# Patient Record
Sex: Female | Born: 1965 | Race: White | Hispanic: No | Marital: Married | Smoking: Current every day smoker
Health system: Southern US, Community
[De-identification: ages and names within clinical notes are randomized; demographics above are authoritative.]

## PROBLEM LIST (undated history)

## (undated) DIAGNOSIS — M797 Fibromyalgia: Secondary | ICD-10-CM

## (undated) DIAGNOSIS — E785 Hyperlipidemia, unspecified: Secondary | ICD-10-CM

## (undated) DIAGNOSIS — F419 Anxiety disorder, unspecified: Secondary | ICD-10-CM

## (undated) DIAGNOSIS — M1990 Unspecified osteoarthritis, unspecified site: Secondary | ICD-10-CM

## (undated) DIAGNOSIS — Z5320 Procedure and treatment not carried out because of patient's decision for unspecified reasons: Secondary | ICD-10-CM

## (undated) DIAGNOSIS — I10 Essential (primary) hypertension: Secondary | ICD-10-CM

## (undated) DIAGNOSIS — D219 Benign neoplasm of connective and other soft tissue, unspecified: Secondary | ICD-10-CM

## (undated) DIAGNOSIS — K0889 Other specified disorders of teeth and supporting structures: Secondary | ICD-10-CM

## (undated) DIAGNOSIS — E119 Type 2 diabetes mellitus without complications: Secondary | ICD-10-CM

## (undated) DIAGNOSIS — L409 Psoriasis, unspecified: Secondary | ICD-10-CM

## (undated) DIAGNOSIS — E2839 Other primary ovarian failure: Secondary | ICD-10-CM

## (undated) HISTORY — DX: Fibromyalgia: M79.7

## (undated) HISTORY — DX: Psoriasis, unspecified: L40.9

## (undated) HISTORY — PX: MENISCUS REPAIR: SHX5179

## (undated) HISTORY — DX: Hyperlipidemia, unspecified: E78.5

## (undated) HISTORY — DX: Other primary ovarian failure: E28.39

## (undated) HISTORY — PX: MOUTH SURGERY: SHX715

## (undated) HISTORY — DX: Anxiety disorder, unspecified: F41.9

## (undated) HISTORY — DX: Type 2 diabetes mellitus without complications: E11.9

## (undated) HISTORY — DX: Benign neoplasm of connective and other soft tissue, unspecified: D21.9

## (undated) HISTORY — DX: Procedure and treatment not carried out because of patient's decision for unspecified reasons: Z53.20

## (undated) HISTORY — PX: ABDOMINAL HYSTERECTOMY: SHX81

## (undated) HISTORY — DX: Unspecified osteoarthritis, unspecified site: M19.90

---

## 2002-03-03 DIAGNOSIS — M797 Fibromyalgia: Secondary | ICD-10-CM

## 2002-03-03 HISTORY — DX: Fibromyalgia: M79.7

## 2002-09-15 ENCOUNTER — Inpatient Hospital Stay (HOSPITAL_COMMUNITY): Admission: EM | Admit: 2002-09-15 | Discharge: 2002-09-19 | Payer: Self-pay | Admitting: Psychiatry

## 2003-09-24 ENCOUNTER — Emergency Department (HOSPITAL_COMMUNITY): Admission: EM | Admit: 2003-09-24 | Discharge: 2003-09-24 | Payer: Self-pay | Admitting: Emergency Medicine

## 2008-03-09 ENCOUNTER — Ambulatory Visit (HOSPITAL_COMMUNITY): Admission: RE | Admit: 2008-03-09 | Discharge: 2008-03-11 | Payer: Self-pay | Admitting: Obstetrics and Gynecology

## 2008-03-09 ENCOUNTER — Encounter (INDEPENDENT_AMBULATORY_CARE_PROVIDER_SITE_OTHER): Payer: Self-pay | Admitting: Obstetrics and Gynecology

## 2010-06-17 LAB — CBC
HCT: 37.3 % (ref 36.0–46.0)
HCT: 39.5 % (ref 36.0–46.0)
Hemoglobin: 13.2 g/dL (ref 12.0–15.0)
MCV: 93.2 fL (ref 78.0–100.0)
MCV: 93.4 fL (ref 78.0–100.0)
Platelets: 281 10*3/uL (ref 150–400)
Platelets: 303 10*3/uL (ref 150–400)
Platelets: 335 10*3/uL (ref 150–400)
RBC: 4.01 MIL/uL (ref 3.87–5.11)
RBC: 4.23 MIL/uL (ref 3.87–5.11)
RDW: 12.4 % (ref 11.5–15.5)
WBC: 8.1 10*3/uL (ref 4.0–10.5)

## 2010-06-17 LAB — COMPREHENSIVE METABOLIC PANEL
AST: 25 U/L (ref 0–37)
Alkaline Phosphatase: 60 U/L (ref 39–117)
CO2: 27 mEq/L (ref 19–32)
Calcium: 9.3 mg/dL (ref 8.4–10.5)
Chloride: 101 mEq/L (ref 96–112)
GFR calc non Af Amer: >60 mL/min (ref >60)
Sodium: 138 mEq/L (ref 135–145)
Total Bilirubin: 0.4 mg/dL (ref 0.3–1.2)
Total Protein: 7.3 g/dL (ref 6.0–8.3)

## 2010-06-17 LAB — APTT: aPTT: 28 seconds (ref 24–37)

## 2010-06-17 LAB — URINALYSIS, ROUTINE W REFLEX MICROSCOPIC
Hgb urine dipstick: NEGATIVE
Nitrite: NEGATIVE
Protein, ur: NEGATIVE mg/dL
Specific Gravity, Urine: 1.01 (ref 1.005–1.030)

## 2010-06-17 LAB — DIFFERENTIAL
Eosinophils Absolute: 0.2 10*3/uL (ref 0.0–0.7)
Lymphs Abs: 2.9 10*3/uL (ref 0.7–4.0)
Monocytes Absolute: 0.7 10*3/uL (ref 0.1–1.0)
Monocytes Relative: 8 % (ref 3–12)
Neutro Abs: 4.7 10*3/uL (ref 1.7–7.7)

## 2010-06-17 LAB — GLUCOSE, CAPILLARY: Glucose-Capillary: 130 mg/dL — ABNORMAL HIGH (ref 70–99)

## 2010-06-17 LAB — PROTIME-INR
INR: 0.9 (ref 0.00–1.49)
Prothrombin Time: 11.7 seconds (ref 11.6–15.2)

## 2010-06-17 LAB — URINE MICROSCOPIC-ADD ON

## 2010-06-17 LAB — HEMOGLOBIN AND HEMATOCRIT, BLOOD: Hemoglobin: 13.9 g/dL (ref 12.0–15.0)

## 2010-07-16 NOTE — Discharge Summary (Signed)
NAMEALEKSANDRA, RABEN                ACCOUNT NO.:  [id number]   MEDICAL RECORD NO.:  [id number]          PATIENT TYPE:  INP   LOCATION:  9304                          FACILITY:  [facility]   PHYSICIAN:  Miguel Aschoff, M.D.       DATE OF BIRTH:  1966/02/21   DATE OF ADMISSION:  03/09/2008  DATE OF DISCHARGE:  03/11/2008                               DISCHARGE SUMMARY   ADMISSION DIAGNOSES:  1. Menorrhagia.  2. Dysmenorrhea.   FINAL DIAGNOSES:  1. Menorrhagia.  2. Dysmenorrhea.  3. Leiomyomas.   OPERATIONS AND PROCEDURES:  Laparoscopically-assisted vaginal  hysterectomy, general anesthesia.   BRIEF HISTORY:  The patient is a 45 year old white female with a history  of progressive menorrhagia and dysmenorrhea.  The patient had been  offered alternative therapies for her heavy menses; however, she  requested definitive procedure be carried out and was brought to the  hospital to undergo laparoscopically-assisted vaginal hysterectomy to  correct her heavy menses and dysmenorrhea.  The patient had preoperative  studies obtained.  This include admission hemoglobin of 15.0, hematocrit  44.6, white count of 8600.  Laboratory studies revealed normal PT/PTT.  Chemistries showed elevation of blood sugar at 180.  Urinalysis was  positive for sugar, otherwise negative.   HOSPITAL COURSE:  Under general anesthesia on March 09, 2008, a  topically-assisted vaginal hysterectomy was carried out without  difficulty.  The patient had an essentially uncomplicated postoperative  course tolerating increasing ambulation and diet well and by the second  postoperative day was off IV pain medication, tolerating regular diet in  satisfactory condition, and felt stable enough to be discharged home.  Medications for home included Tylox every 3 hours as needed for pain.  The patient was instructed to resume all her other pain medications for  both asthma and fibromyalgia.  The patient is to be seen back in 4 weeks  for followup examination.  She was to place nothing in the vagina for 4  weeks, to call for any problems such as fever, pain, or heavy bleeding.  The final pathology report revealed the presence of leiomyomas.  Otherwise, the cervix and uterus revealed benign findings.      Miguel Aschoff, M.D.  Electronically Signed     AR/MEDQ  D:  03/11/2008  T:  03/11/2008  Job:  [id number]

## 2010-07-16 NOTE — Op Note (Signed)
Karina Rice, Karina Rice                ACCOUNT NO.:  [id number]   MEDICAL RECORD NO.:  [id number]          PATIENT TYPE:  INP   LOCATION:  9304                          FACILITY:  [facility]   PHYSICIAN:  Miguel Aschoff, M.D.       DATE OF BIRTH:  09-Dec-1965   DATE OF PROCEDURE:  03/09/2008  DATE OF DISCHARGE:                               OPERATIVE REPORT   PREOPERATIVE DIAGNOSES:  1. Dysmenorrhea.  2. Menorrhagia.   POSTOPERATIVE DIAGNOSES:  1. Dysmenorrhea.  2. Menorrhagia.   PROCEDURE:  Laparoscopically-assisted vaginal hysterectomy.   SURGEON:  Miguel Aschoff, MD   ASSISTANT:  Luvenia Redden, MD   ANESTHESIA:  General.   COMPLICATIONS:  None.   JUSTIFICATION:  The patient is a 45 year old white female who reports  history of very heavy menses as well as very painful periods.  The  patient was evaluated in the office and at that time, was noted to have  essentially normal exam and was offered several different alternatives  to treatment including Depo-Lupron, Mirena IUD, NovaSure endometrial  ablation, or hysterectomy.  After reviewing the risks and benefits of  each procedure, the patient was opted to undergo laparoscopic  hysterectomy.  The risks and benefits of the procedure were discussed  with the patient as well as the possibility that an abdominal  hysterectomy would be needed and informed consent has been obtained.   DESCRIPTION OF PROCEDURE:  The patient was taken to the operating room  and placed in the supine position.  General anesthesia was administered  without difficulty.  She was then placed in dorsal lithotomy position,  prepped and draped in the usual sterile fashion.  Foley catheter was  inserted.  At this point, the speculum was placed in the vaginal vault.  The anterior cervical lip was grasped with a tenaculum and then a Hulka  tenaculum was placed through the cervix and held for manipulation of the  uterus during laparoscopic portion of the procedure.  At this  point,  attention was directed to the umbilicus where a small infraumbilical  incision was made.  A Veress needle was inserted, then the abdomen was  insufflated with 3 liters of CO2.  Following the insufflation, the  trocar to laparoscope was placed followed by laparoscope itself.  Once  this was done under direct visualization, 2 accessory ports were  established in the right and left lower quadrants and then systematic  inspection of the abdominal organs was carried out.  The uterus appeared  to be globular, normal in size, and normal in shape.  There was no  evidence of any anterior problems with the bladder and peritoneum.  Round ligaments were unremarkable.  The tubes were normal along their  course and the fimbriae were fine and delicate.  The ovaries were  inspected and again were noted to be totally within normal limits.  Inspection of cul-de-sac was unremarkable.  There were no adhesions  noted in the cul-de-sac and no implants consistent with endometriosis.  The intestinal surfaces that appeared to be normal.  The liver surface  was unremarkable.  At  this point, the gyrus unit was introduced through  the right-sided 5-mm port and then, the fallopian tube, round ligament,  and utero-ovarian ligaments were grasped, cauterized, and cut in serial  fashion and dissection continued along the broad ligament adjacent to  the uterus with a gyrus unit using serial cuts and cauterization in  several levels of the uterine vessel was found.  At this point, the  identical procedure was then carried out on the left side again until  the level of the uterine vessels.  Once this portion of the procedure  was completed, attention was directed back to the perineum.  The patient  was placed in high-dorsal lithotomy position.  A weighted speculum was  placed in the vaginal vault.  The anterior cervical lip was then grasped  and then injected with a total of 7 mL of 1% Xylocaine with epinephrine   for hemostasis and the cervical mucosa was circumscribed and dissected  anteriorly and posteriorly until the peritoneal reflections were found.  At this point using the gyrus unit, the uterosacral ligaments were  grasped, cauterized, and cut.  The cardinal ligaments were then  cauterized and cut in  a similar fashion.  Additional bites were then  taken of the paracervical fascia, each time these were cauterized and  cut.  At this point, the anterior peritoneum was then entered, then  additional bites were taken of the residual broad ligament structures,  each side cauterizing the broad ligament and then cutting.  This was  done until as possible to free the specimen which consisted of the  cervix and the uterus.  At this point, the vaginal cuff was run using  running interlocking 0-Vicryl suture.  There was one point of bleeding  noted on the right portion of the cuff.  This was brought under control  using a figure-of-eight suture of 0-Vicryl suture.  Once this was done,  lap counts were taken and found to be correct and then the vagina was  closed using running interlocking 0-Vicryl suture.  At this point, there  was excellent hemostasis.  The vaginal pack of iodoform gauze was  placed.  Attention was directed back to the umbilicus to ensure that  there was excellent hemostasis intra-abdominally.  The abdomen was  reinsufflated and inspection of the excision site showed good  hemostasis.  There was no evidence of any hemorrhage with careful  inspection of the intra-abdominal pressure.  At this point, it was  elected to complete the procedure.  The CO2 was allowed to escape.  All  instruments were removed and small incisions were then closed using  subcuticular 3-0 Vicryl.  The patient was then taken out of lithotomy  position and brought to recovery room in satisfactory condition.  The  estimated blood loss was approximately 150-200 mL.  Plan was for the  patient to be admitted PCA  morphine for pain control and will be  reassessed on March 10, 2008, for possible discharge.      Miguel Aschoff, M.D.  Electronically Signed     AR/MEDQ  D:  03/09/2008  T:  03/10/2008  Job:  [id number]

## 2010-07-19 NOTE — Discharge Summary (Signed)
NAME:  Karina Rice, Karina Rice                          ACCOUNT NO.:  [id number]   MEDICAL RECORD NO.:  [id number]                   PATIENT TYPE:  IPS   LOCATION:  0507                                 [facility]ACILITY:  [facility]   PHYSICIAN:  Geoffery Lyons, M.D.                   DATE OF BIRTH:  [birth year]   DATE OF ADMISSION:  09/15/2002  DATE OF DISCHARGE:  09/19/2002                                 DISCHARGE SUMMARY   CHIEF COMPLAINT AND PRESENT ILLNESS:  This was the first admission to [facility] for this 45 year old married white female  voluntarily admitted.  She was referred by her psychiatrist due to anger and  anxiety toward the employer benefits supervisor for denying disability.  She  claimed that the job was too stressful, out of work since March 4.  Husband  was ill.  She was caring for her ill mother.  She had a 30 pound weight gain  in the past four months.  She endorsed hopelessness.  She was positive for  homicidal ideas toward the benefit supervisor.   PAST PSYCHIATRIC HISTORY:  She sees Milagros Evener, M.D., as an outpatient,  Jerral Bonito, therapist.   SUBSTANCE ABUSE HISTORY:  She denied the use or abuse of any substances.   PAST MEDICAL HISTORY:  She denies any headaches, asthma.   MEDICATIONS:  1. Frova 2.5 mg for migraine.  2. Lipitor 40 mg daily.  3. Singulair 10 mg daily.  4. Nortriptyline 50 mg at night.  5. Flexeril 10 mg three times a day.  6. Xanax 1.0 mg three times a day.  7. Lexapro 10 mg, started a week prior to this admission.   PHYSICAL EXAMINATION:  Physical examination was performed, failed to show  any acute findings.   MENTAL STATUS EXAM:  Mental status exam revealed a fully alert, cooperative  female, depressed affect, somewhat anxious, directable.  Speech: Normal  pace, monotonous tone.  Mood: Depressed and anxious, angry, resentful,  hopeless.  Positive for homicidal ideation, positive for suicidal ideation,  can contract for  safety.  Cognitive: Cognition was well preserved.   ADMISSION DIAGNOSES:   AXIS I:  1. Depressive disorder, not otherwise specified.  2. Anxiety disorder, not otherwise specified.   AXIS II:  No diagnosis.   AXIS III:  1. Migraine.  2. Tension headache.  3. Asthma.   AXIS IV:  Moderate.   AXIS V:  Global assessment of functioning upon admission 30, highest global  assessment of functioning in the last year 65.   LABORATORY DATA:  Other laboratory workup: CBC was within normal limits.  Blood chemistries were within normal limits.  Thyroid profile was within  normal limits.   HOSPITAL COURSE:  She was admitted and started in intensive individual and  group psychotherapy.  She was placed on the Frova, the Xanax 1 mg three  times a day  as needed, Lexapro 20 mg daily, nortriptyline 25 mg, Flexeril 10  mg three times a day, Lipitor 40 mg daily, and Singulair 10 mg every day;  given some Ambien for sleep.  She claimed to have felt very depressed, felt  homicidal toward the people who denied her disability, very overwhelmed with  all that was going on, feeling hopeless, helpless, a lot of anxiety, not  sleeping well, homicidal ruminations, but said she would not hurt them.  There was a session with the husband.  Husband thought she was more calm.  She was wanting to be discharged.  She was feeling better.  On July 19, she  was much improved, sleeping well, mood actually was better, more positive  about things, had worked on Pharmacologist, was ready to let go and move on  if needed.  She endorsed no suicidal ideas, no homicidal ideas, and  willingness to pursue further outpatient treatment.   DISCHARGE DIAGNOSES:   AXIS I:  1. Depressive disorder, not otherwise specified.  2. Anxiety disorder, not otherwise specified.   AXIS II:  No diagnosis.   AXIS III:  1. Migraine headaches.  2. Tension headaches.  3. Asthma.   AXIS IV:  Moderate.   AXIS V:  Global assessment of  functioning upon discharge 55-60.   DISCHARGE MEDICATIONS:  1. Lexapro 20 mg per day.  2. Pamelor 25 mg at night.  3. Lipitor 40 mg daily.  4. Singulair 10 mg daily.  5. Flexeril 10 mg twice a day.  6. Ambien 10 mg at bedtime for sleep.  7. Xanax 1.0 mg three times a day as needed for anxiety.    FOLLOW UP:  She was to follow up with Milagros Evener, M.D., and Jerral Bonito.                                                 Geoffery Lyons, M.D.    IL/MEDQ  D:  10/12/2002  T:  10/12/2002  Job:  [id number]
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
## 2014-04-28 ENCOUNTER — Other Ambulatory Visit: Payer: Self-pay | Admitting: Orthopaedic Surgery

## 2014-04-28 DIAGNOSIS — M25512 Pain in left shoulder: Secondary | ICD-10-CM

## 2014-05-02 ENCOUNTER — Ambulatory Visit
Admission: RE | Admit: 2014-05-02 | Discharge: 2014-05-02 | Disposition: A | Discharge status: Home / Self Care | Payer: Managed Care, Other (non HMO) | Source: Ambulatory Visit | Attending: Orthopaedic Surgery | Admitting: Orthopaedic Surgery

## 2014-05-02 DIAGNOSIS — M25512 Pain in left shoulder: Secondary | ICD-10-CM

## 2015-03-05 ENCOUNTER — Telehealth: Payer: Self-pay

## 2015-03-05 ENCOUNTER — Ambulatory Visit (INDEPENDENT_AMBULATORY_CARE_PROVIDER_SITE_OTHER): Payer: Managed Care, Other (non HMO)

## 2015-03-05 ENCOUNTER — Ambulatory Visit (INDEPENDENT_AMBULATORY_CARE_PROVIDER_SITE_OTHER): Payer: Managed Care, Other (non HMO) | Admitting: Emergency Medicine

## 2015-03-05 VITALS — BP 154/88 | HR 104 | Temp 98.2°F | Resp 20 | Ht 63.5 in | Wt 188.8 lb

## 2015-03-05 DIAGNOSIS — J4530 Mild persistent asthma, uncomplicated: Secondary | ICD-10-CM

## 2015-03-05 DIAGNOSIS — J0140 Acute pansinusitis, unspecified: Secondary | ICD-10-CM

## 2015-03-05 DIAGNOSIS — J209 Acute bronchitis, unspecified: Secondary | ICD-10-CM

## 2015-03-05 MED ORDER — HYDROCOD POLST-CPM POLST ER 10-8 MG/5ML PO SUER
5.0000 mL | Freq: Two times a day (BID) | ORAL | Status: DC
Start: 2015-03-05 — End: 2018-11-01

## 2015-03-05 MED ORDER — LEVOFLOXACIN 500 MG PO TABS
500.0000 mg | ORAL_TABLET | Freq: Every day | ORAL | Status: AC
Start: 2015-03-05 — End: 2015-03-15

## 2015-03-05 MED ORDER — PSEUDOEPHEDRINE-GUAIFENESIN ER 60-600 MG PO TB12
1.0000 | ORAL_TABLET | Freq: Two times a day (BID) | ORAL | Status: AC
Start: 2015-03-05 — End: 2016-03-04

## 2015-03-05 NOTE — Patient Instructions (Signed)

## 2015-03-05 NOTE — Progress Notes (Signed)
Subjective:  Patient ID: Karina Rice, female    DOB: 24-Feb-1966  Age: 50 y.o. MRN: [id number]  CC: Shortness of Breath; Cough; and Wheezing   HPI Karina Rice presents   Patient has history of asthma and is been using her nebulized aerosol with albuterol last several days. She's noticed that she has a cough productive purulent sputm. She has some shortness of breath with exertion and generalized wheezing. Wheezing is been modestly improved with nebulized aerosol. She has no fever or chills Currently. Avitene onset of her illness fever. She has nasal congestion postnasal drainage. Nasal discharge is purulent character. [name] has no sore throat or pain in her ears.  History Karina Rice has a past medical history of Diabetes mellitus without complication ([place]); Fibromyalgia (2004); and Hyperlipidemia.   She has past surgical history that includes Abdominal hysterectomy and Mouth surgery.   Her  family history includes Kidney disease in her sister; Mental illness in her sister.  She   reports that she has been smoking.  She does not have any smokeless tobacco history on file. She reports that she does not drink alcohol. Her drug history is not on file.  No outpatient prescriptions prior to visit.   No facility-administered medications prior to visit.    Social History   Social History  . Marital Status: Married    Spouse Name: N/A  . Number of Children: N/A  . Years of Education: N/A   Social History Main Topics  . Smoking status: Current Every Day Smoker  . Smokeless tobacco: None  . Alcohol Use: No  . Drug Use: None  . Sexual Activity: Not Asked   Other Topics Concern  . None   Social History Narrative  . None     Review of Systems  Constitutional: Negative for fever, chills and appetite change.  HENT: Positive for congestion, postnasal drip, rhinorrhea, sinus pressure and sore throat. Negative for ear pain.   Eyes: Negative for pain and redness.  Respiratory:  Positive for cough, shortness of breath and wheezing.   Cardiovascular: Negative for leg swelling.  Gastrointestinal: Negative for nausea, vomiting, abdominal pain, diarrhea, constipation and blood in stool.  Endocrine: Negative for polyuria.  Genitourinary: Negative for dysuria, urgency, frequency and flank pain.  Musculoskeletal: Negative for gait problem.  Skin: Negative for rash.  Neurological: Negative for weakness and headaches.  Psychiatric/Behavioral: Negative for confusion and decreased concentration. The patient is not nervous/anxious.     Objective:  BP 154/88 mmHg  Pulse 104  Temp(Src) 98.2 F (36.8 C) (Oral)  Resp 20  Ht 5' 3.5" (1.613 m)  Wt 188 lb 12.8 oz (85.639 kg)  BMI 32.92 kg/m2  SpO2 91%  Physical Exam  Constitutional: She is oriented to person, place, and time. She appears well-developed and well-nourished. No distress.  HENT:  Head: Normocephalic and atraumatic.  Right Ear: External ear normal.  Left Ear: External ear normal.  Nose: Nose normal.  Eyes: Conjunctivae and EOM are normal. Pupils are equal, round, and reactive to light. No scleral icterus.  Neck: Normal range of motion. Neck supple. No tracheal deviation present.  Cardiovascular: Normal rate, regular rhythm and normal heart sounds.   Pulmonary/Chest: Effort normal. No respiratory distress. She has no wheezes. She has rales.  Abdominal: She exhibits no mass. There is no tenderness. There is no rebound and no guarding.  Musculoskeletal: She exhibits no edema.  Lymphadenopathy:    She has no cervical adenopathy.  Neurological: She is alert and oriented  to person, place, and time. Coordination normal.  Skin: Skin is warm and dry. No rash noted.  Psychiatric: She has a normal mood and affect. Her behavior is normal.      Assessment & Plan:   Karina Rice was seen today for shortness of breath, cough and wheezing.  Diagnoses and all orders for this visit:  Acute bronchitis, unspecified  organism -     DG Chest 2 View; Future  Acute pansinusitis, recurrence not specified  Asthma, mild persistent, uncomplicated  Other orders -     levofloxacin (LEVAQUIN) 500 MG tablet; Take 1 tablet (500 mg total) by mouth daily. -     chlorpheniramine-HYDROcodone (TUSSIONEX PENNKINETIC ER) 10-8 MG/5ML SUER; Take 5 mLs by mouth 2 (two) times daily. -     pseudoephedrine-guaifenesin (MUCINEX D) 60-600 MG 12 hr tablet; Take 1 tablet by mouth every 12 (twelve) hours.  I am having Ms. Karina Rice start on levofloxacin, chlorpheniramine-HYDROcodone, and pseudoephedrine-guaifenesin. I am also having her maintain her cyclobenzaprine, ALPRAZolam, montelukast, buPROPion, DULoxetine, metFORMIN, and rosuvastatin.  Meds ordered this encounter  Medications  . cyclobenzaprine (FLEXERIL) 10 MG tablet    Sig: Take 10 mg by mouth 2 (two) times daily.  Marland Kitchen ALPRAZolam (XANAX) 1 MG tablet    Sig: Take 1 mg by mouth 3 (three) times daily as needed for anxiety.  . montelukast (SINGULAIR) 10 MG tablet    Sig: Take 10 mg by mouth at bedtime.  Marland Kitchen buPROPion (WELLBUTRIN SR) 150 MG 12 hr tablet    Sig: Take 150 mg by mouth 2 (two) times daily.  . DULoxetine (CYMBALTA) 60 MG capsule    Sig: Take 60 mg by mouth daily.  . metFORMIN (GLUCOPHAGE) 1000 MG tablet    Sig: Take 1,000 mg by mouth 2 (two) times daily with a meal.  . rosuvastatin (CRESTOR) 20 MG tablet    Sig: Take 20 mg by mouth daily.  Marland Kitchen levofloxacin (LEVAQUIN) 500 MG tablet    Sig: Take 1 tablet (500 mg total) by mouth daily.    Dispense:  10 tablet    Refill:  0  . chlorpheniramine-HYDROcodone (TUSSIONEX PENNKINETIC ER) 10-8 MG/5ML SUER    Sig: Take 5 mLs by mouth 2 (two) times daily.    Dispense:  60 mL    Refill:  0  . pseudoephedrine-guaifenesin (MUCINEX D) 60-600 MG 12 hr tablet    Sig: Take 1 tablet by mouth every 12 (twelve) hours.    Dispense:  18 tablet    Refill:  0    Appropriate red flag conditions were discussed with the patient as well as  actions that should be taken.  Patient expressed his understanding.  Follow-up: Return if symptoms worsen or fail to improve.  Roselee Culver, MD   [facility] reading (PRIMARY) by  Dr. Ouida Sills  Questionable RLL infiltrate.

## 2015-03-05 NOTE — Telephone Encounter (Signed)
Called patient to inform her that she left one of her RXs behind. RX is located at the front desk.

## 2015-04-09 ENCOUNTER — Ambulatory Visit (INDEPENDENT_AMBULATORY_CARE_PROVIDER_SITE_OTHER): Payer: Managed Care, Other (non HMO) | Admitting: Family Medicine

## 2015-04-09 VITALS — BP 122/78 | HR 99 | Temp 98.4°F | Resp 18 | Wt 189.4 lb

## 2015-04-09 DIAGNOSIS — A09 Infectious gastroenteritis and colitis, unspecified: Secondary | ICD-10-CM | POA: Diagnosis not present

## 2015-04-09 DIAGNOSIS — R197 Diarrhea, unspecified: Secondary | ICD-10-CM

## 2015-04-09 LAB — POCT CBC
Granulocyte percent: 61.1 %G (ref 37–80)
HCT, POC: 44.4 % (ref 37.7–47.9)
Hemoglobin: 15 g/dL (ref 12.2–16.2)
LYMPH, POC: 3.3 (ref 0.6–3.4)
MCH, POC: 29.8 pg (ref 27–31.2)
MCHC: 33.8 g/dL (ref 31.8–35.4)
MCV: 88.3 fL (ref 80–97)
MID (cbc): 0.9 (ref 0–0.9)
MPV: 7.3 fL (ref 0–99.8)
PLATELET COUNT, POC: 274 10*3/uL (ref 142–424)
POC Granulocyte: 6.6 (ref 2–6.9)
POC LYMPH %: 30.8 % (ref 10–50)
POC MID %: 8.1 %M (ref 0–12)
RBC: 5.03 M/uL (ref 4.04–5.48)
RDW, POC: 13.2 %
WBC: 10.8 10*3/uL — AB (ref 4.6–10.2)

## 2015-04-09 LAB — COMPREHENSIVE METABOLIC PANEL
ALBUMIN: 4.3 g/dL (ref 3.6–5.1)
ALT: 28 U/L (ref 6–29)
AST: 31 U/L (ref 10–35)
Alkaline Phosphatase: 59 U/L (ref 33–130)
BILIRUBIN TOTAL: 0.5 mg/dL (ref 0.2–1.2)
BUN: 11 mg/dL (ref 7–25)
CO2: 26 mmol/L (ref 20–31)
CREATININE: 0.66 mg/dL (ref 0.50–1.05)
Calcium: 9.5 mg/dL (ref 8.6–10.4)
Chloride: 102 mmol/L (ref 98–110)
Glucose, Bld: 114 mg/dL — ABNORMAL HIGH (ref 65–99)
Potassium: 4.4 mmol/L (ref 3.5–5.3)
SODIUM: 138 mmol/L (ref 135–146)
TOTAL PROTEIN: 7.2 g/dL (ref 6.1–8.1)

## 2015-04-09 LAB — LIPASE: LIPASE: 22 U/L (ref 7–60)

## 2015-04-09 NOTE — Patient Instructions (Signed)
Please bring in your stool sample as soon as you can- I suspect that you may have C diff which is a type of bacteria that can cause diarrhea.  For the time being let's wait on any antibiotics as the presence of c diff will change our plan.

## 2015-04-09 NOTE — Progress Notes (Addendum)
[facility] [address] [place] [address] [phone]  Date:  04/09/2015   Name:  Karina Rice   DOB:  [birth year]   MRN:  [id number]  PCP:  No primary care provider on file.    Chief Complaint: Diarrhea   History of Present Illness:  Karina Rice is a 50 y.o. very pleasant female patient who presents with the following:  History of DM, fibromyalgia and high cholesterol. Here today with complaint of diarrhea for 2 weeks, "I just feel drained," she has soreness in her abdomen from frequent cramping and stools She is having about 10 BM per day right now- sometimes loose and watery, sometimes just soft.   She is having some gas, but no mucus, no blood.  No black stools No vomiting.  She is still eating She has never had this in the past.   No travel, no camping. No suspicious foods  She was treated with levaquin in early January for sinusitis.   Her DM doctor is Dr. Olevia Bowens- most recent A1c was 6.5%.   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  However she has not worked a lot recently.   Her husband is here today with similar sx although he has only been ill for 3 days so far S/p partial hyst but no other abd operations  There are no active problems to display for this patient.   Past Medical History  Diagnosis Date  . Diabetes mellitus without complication ([place])   . Fibromyalgia 2004  . Hyperlipidemia     Past Surgical History  Procedure Laterality Date  . Abdominal hysterectomy    . Mouth surgery      Social History  Substance Use Topics  . Smoking status: Current Every Day Smoker  . Smokeless tobacco: None  . Alcohol Use: No    Family History  Problem Relation Age of Onset  . Mental illness Sister   . Kidney disease Sister     Allergies  Allergen Reactions  . Aspirin Swelling  . Lisinopril     Abdominal pain    Medication list has been reviewed and updated.  Current Outpatient Prescriptions on File Prior to Visit   Medication Sig Dispense Refill  . ALPRAZolam (XANAX) 1 MG tablet Take 1 mg by mouth 3 (three) times daily as needed for anxiety.    Marland Kitchen buPROPion (WELLBUTRIN SR) 150 MG 12 hr tablet Take 150 mg by mouth 2 (two) times daily.    . chlorpheniramine-HYDROcodone (TUSSIONEX PENNKINETIC ER) 10-8 MG/5ML SUER Take 5 mLs by mouth 2 (two) times daily. 60 mL 0  . cyclobenzaprine (FLEXERIL) 10 MG tablet Take 10 mg by mouth 2 (two) times daily.    . DULoxetine (CYMBALTA) 60 MG capsule Take 60 mg by mouth daily.    . metFORMIN (GLUCOPHAGE) 1000 MG tablet Take 1,000 mg by mouth 2 (two) times daily with a meal.    . montelukast (SINGULAIR) 10 MG tablet Take 10 mg by mouth at bedtime.    . pseudoephedrine-guaifenesin (MUCINEX D) 60-600 MG 12 hr tablet Take 1 tablet by mouth every 12 (twelve) hours. 18 tablet 0  . rosuvastatin (CRESTOR) 20 MG tablet Take 20 mg by mouth daily.     No current facility-administered medications on file prior to visit.    Review of Systems:  As per HPI- otherwise negative.   Physical Examination: Filed Vitals:   04/09/15 1042  BP: 122/78  Pulse: 99  Temp:  98.4 F (36.9 C)  Resp: 18   Filed Vitals:   04/09/15 1042  Weight: 189 lb 6.4 oz (85.911 kg)   Body mass index is 33.02 kg/(m^2). Ideal Body Weight:    GEN: WDWN, NAD, Non-toxic, A & O x 3, obese, tobacco odor HEENT: Atraumatic, Normocephalic. Neck supple. No masses, No LAD.  Bilateral TM wnl, oropharynx normal.  PEERL,EOMI.   Ears and Nose: No external deformity. CV: RRR, No M/G/R. No JVD. No thrill. No extra heart sounds. PULM: CTA B, no wheezes, crackles, rhonchi. No retractions. No resp. distress. No accessory muscle use. ABD: S, ND, +BS. No rebound. No HSM.  She notes mild diffuse tenderness over her entire abdomen  EXTR: No c/c/e NEURO Normal gait.  PSYCH: Normally interactive. Conversant. Not depressed or anxious appearing.  Calm demeanor.   Results for orders placed or performed in visit on 04/09/15   POCT CBC  Result Value Ref Range   WBC 10.8 (A) 4.6 - 10.2 K/uL   Lymph, poc 3.3 0.6 - 3.4   POC LYMPH PERCENT 30.8 10 - 50 %L   MID (cbc) 0.9 0 - 0.9   POC MID % 8.1 0 - 12 %M   POC Granulocyte 6.6 2 - 6.9   Granulocyte percent 61.1 37 - 80 %G   RBC 5.03 4.04 - 5.48 M/uL   Hemoglobin 15.0 12.2 - 16.2 g/dL   HCT, POC 44.4 37.7 - 47.9 %   MCV 88.3 80 - 97 fL   MCH, POC 29.8 27 - 31.2 pg   MCHC 33.8 31.8 - 35.4 g/dL   RDW, POC 13.2 %   Platelet Count, POC 274 142 - 424 K/uL   MPV 7.3 0 - 99.8 fL    Assessment and Plan: Diarrhea of presumed infectious origin - Plan: POCT CBC, Comprehensive metabolic panel, Lipase, Clostridium Difficile by PCR  Here today with a diarrhea history suspicious for d diff.  She was not able to give a stool sample here but we hope she will be able to get one later today Decided to wait on starting any treatment until her results are in as she has already been sick for over a week.  She will let me know if any worsening in the meantime   Signed Lamar Blinks, MD  Negative c diff 2/8- gave her a call.  At this point would be reasonable to try a short course of cipro for 3 days. However she feels like she is improving and prefers to ride it out which I agree is the best option She reports that her husband is also feeling better but she will ask him to let me know if he continues to have pain. She will let me know if she does not continue to improve  Results for orders placed or performed in visit on 04/09/15  Clostridium Difficile by PCR  Result Value Ref Range   Toxigenic C Difficile by pcr Not Detected Not Detected  Comprehensive metabolic panel  Result Value Ref Range   Sodium 138 135 - 146 mmol/L   Potassium 4.4 3.5 - 5.3 mmol/L   Chloride 102 98 - 110 mmol/L   CO2 26 20 - 31 mmol/L   Glucose, Bld 114 (H) 65 - 99 mg/dL   BUN 11 7 - 25 mg/dL   Creat 0.66 0.50 - 1.05 mg/dL   Total Bilirubin 0.5 0.2 - 1.2 mg/dL   Alkaline Phosphatase 59 33 - 130  U/L   AST 31 10 -  35 U/L   ALT 28 6 - 29 U/L   Total Protein 7.2 6.1 - 8.1 g/dL   Albumin 4.3 3.6 - 5.1 g/dL   Calcium 9.5 8.6 - 10.4 mg/dL  Lipase  Result Value Ref Range   Lipase 22 7 - 60 U/L  POCT CBC  Result Value Ref Range   WBC 10.8 (A) 4.6 - 10.2 K/uL   Lymph, poc 3.3 0.6 - 3.4   POC LYMPH PERCENT 30.8 10 - 50 %L   MID (cbc) 0.9 0 - 0.9   POC MID % 8.1 0 - 12 %M   POC Granulocyte 6.6 2 - 6.9   Granulocyte percent 61.1 37 - 80 %G   RBC 5.03 4.04 - 5.48 M/uL   Hemoglobin 15.0 12.2 - 16.2 g/dL   HCT, POC 44.4 37.7 - 47.9 %   MCV 88.3 80 - 97 fL   MCH, POC 29.8 27 - 31.2 pg   MCHC 33.8 31.8 - 35.4 g/dL   RDW, POC 13.2 %   Platelet Count, POC 274 142 - 424 K/uL   MPV 7.3 0 - 99.8 fL  '

## 2015-04-11 LAB — CLOSTRIDIUM DIFFICILE BY PCR: Toxigenic C. Difficile by PCR: NOT DETECTED

## 2017-07-09 IMAGING — CR DG CHEST 2V
2 series · 2 of 2 positions shown · non-contrast
Comparison: None.

CLINICAL DATA: Patient with cough, congestion shortness of breath
for 5 days.

EXAM:
CHEST  2 VIEW

[PA]
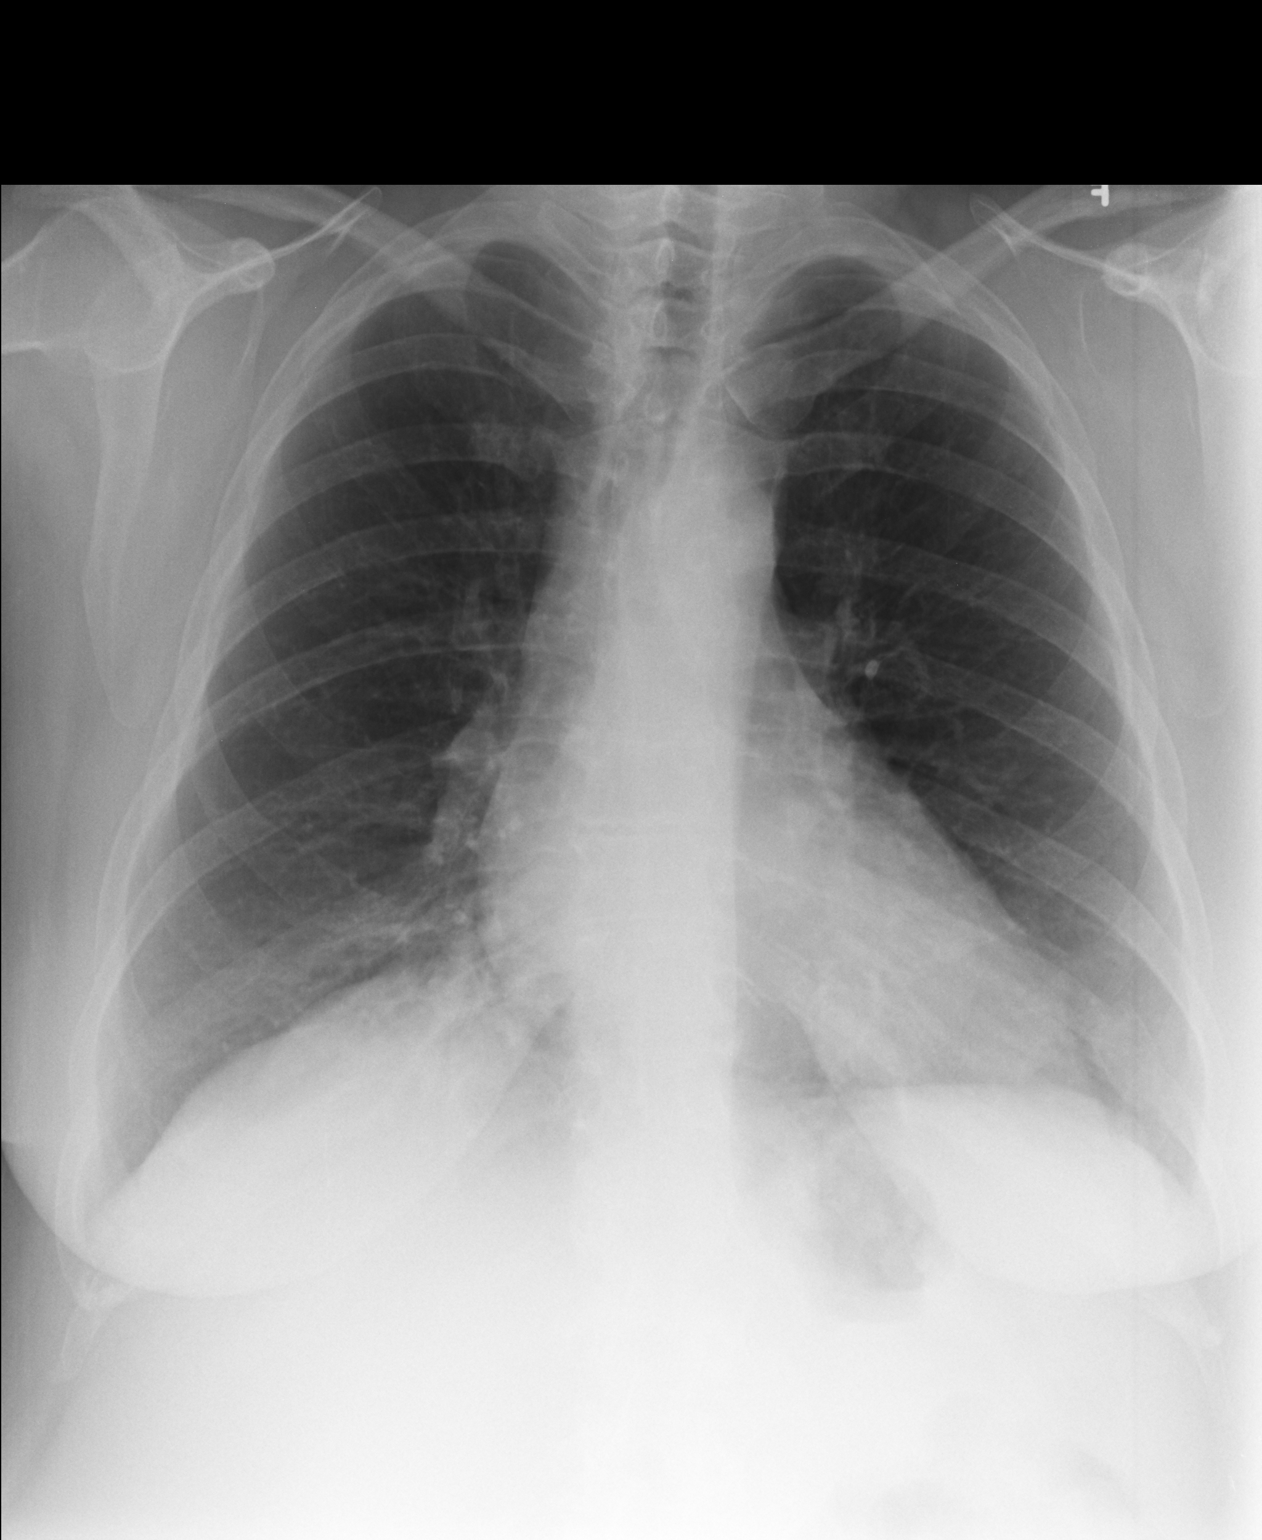

[lateral]
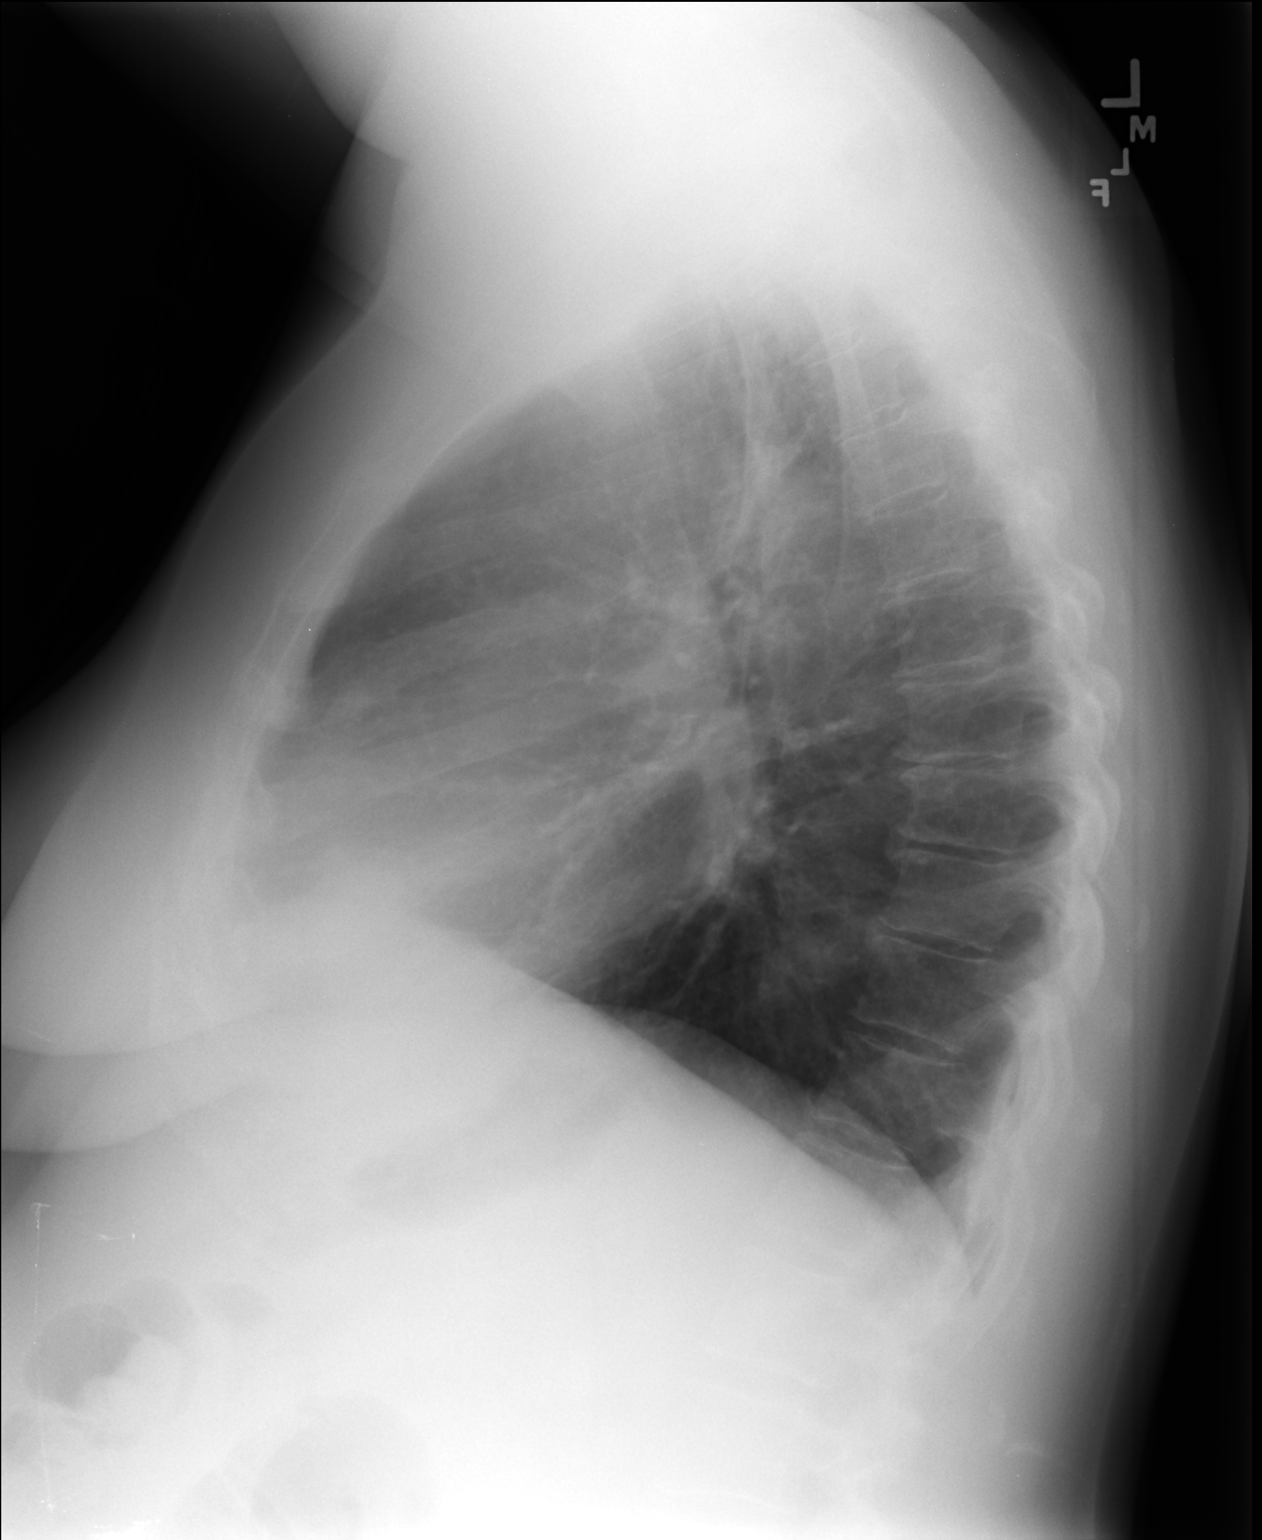

[2 of 2 positions shown; findings below may reference images not displayed]

FINDINGS: Normal cardiac and mediastinal contours. No consolidative pulmonary
opacities. No pleural effusion or pneumothorax. Probable old
posterior left ninth rib fracture. Thoracic spine degenerative
changes.
IMPRESSION: No active cardiopulmonary disease.

## 2018-10-27 ENCOUNTER — Telehealth: Payer: Self-pay | Admitting: *Deleted

## 2018-10-27 NOTE — Telephone Encounter (Signed)
Called and left the patient a message to call the office back. Patient needs a new patient appt  

## 2018-10-28 ENCOUNTER — Telehealth: Payer: Self-pay | Admitting: *Deleted

## 2018-10-28 NOTE — Telephone Encounter (Signed)
Patient return call and scheduled an appt for 9/1

## 2018-10-31 NOTE — Progress Notes (Signed)
Consult Note: Gyn-Onc  Karina Rice 53 y.o. female  CC:  Chief Complaint  Patient presents with  . New Patient (Initial Visit)    HPI: Patient is seen today in consultation at the request of Dr. Jerelyn Rice.  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  It is for this reason that she is referred to [place] today.  She states about 2 months ago she began experiencing some dyspareunia that she thought was secondary to vaginal dryness but then just started noticing some burning on her vulva in general.  She examined herself 1 day in the shower and felt an area that was sore to touch so she sought an appointment.  She is a long history of HPV.  She states that she underwent cryo therapy 4 times with the last time being when she was 53 years old.  She subsequently underwent a hysterectomy 2011 because of irregular bleeding and "I want to do it".  She never took any hormone replacement therapy.  Her mammograms are up-to-date.  She is never had a colonoscopy.  She is a diabetic.  Her last hemoglobin A1c was less than 6 3 months ago.  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  Review of Systems Constitutional: Denies fever. Skin: + lesion vulva Cardiovascular: No chest pain, shortness of breath, or edema  Pulmonary: No cough  Gastro Intestinal: No nausea, vomiting, constipation, or diarrhea reported.  Genitourinary: Denies vaginal bleeding and discharge.  Musculoskeletal: + right hip and hand joint pain, + right SI joint pain   Current Meds:  Outpatient Encounter Medications as of 11/02/2018  Medication Sig  . albuterol (PROVENTIL HFA;VENTOLIN HFA) 108 (90 Base) MCG/ACT inhaler Inhale 2 puffs into the lungs every 6 (six) hours as needed for wheezing or  shortness of breath.  . ALPRAZolam (XANAX) 1 MG tablet Take 1 mg by mouth 3 (three) times daily as needed for anxiety.  Marland Kitchen buPROPion (WELLBUTRIN SR) 150 MG 12 hr tablet Take 150 mg by mouth 2 (two) times daily.  . cephALEXin (KEFLEX) 500 MG capsule Take 500 mg by mouth 4 (four) times daily.  . diclofenac (VOLTAREN) 75 MG EC tablet Take 75 mg by mouth 2 (two) times daily.  . DULoxetine (CYMBALTA) 60 MG capsule Take 60 mg by mouth daily.  Marland Kitchen estradiol (ESTRACE) 0.1 MG/GM vaginal cream Place 1 Applicatorful vaginally at bedtime.  . folic acid (FOLVITE) 1 MG tablet Take 1 mg by mouth daily.  Marland Kitchen losartan (COZAAR) 25 MG tablet Take 25 mg by mouth daily.  . metFORMIN (GLUCOPHAGE) 1000 MG tablet Take 1,000 mg by mouth 2 (two) times daily with a meal.  . montelukast (SINGULAIR) 10 MG tablet Take 10 mg by mouth at bedtime.  . pregabalin (LYRICA) 150 MG capsule Take 150 mg by mouth 3 (three) times daily.  . rosuvastatin (CRESTOR) 20 MG tablet Take 20 mg by mouth daily.  Marland Kitchen sulfamethoxazole-trimethoprim (BACTRIM DS) 800-160 MG tablet Take 1 tablet by mouth 2 (two) times daily.  Marland Kitchen Ustekinumab (STELARA) 45 MG/0.5ML SOLN Inject into the skin.  . [DISCONTINUED] chlorpheniramine-HYDROcodone (TUSSIONEX PENNKINETIC ER) 10-8 MG/5ML SUER Take 5 mLs by mouth 2 (two) times daily. (Patient not taking: Reported on 11/01/2018)  . [  DISCONTINUED] cyclobenzaprine (FLEXERIL) 10 MG tablet Take 10 mg by mouth 2 (two) times daily.  . [DISCONTINUED] diclofenac (VOLTAREN) 75 MG EC tablet Take 75 mg by mouth 2 (two) times daily.   No facility-administered encounter medications on file as of 11/02/2018.     Allergy:  Allergies  Allergen Reactions  . Aspirin Swelling  . Latex Hives       . Lisinopril     Abdominal pain    Social Hx:   Social History   Socioeconomic History  . Marital status: Married    Spouse name: Not on file  . Number of children: Not on file  . Years of education: Not on file  . Highest education  level: Not on file  Occupational History  . Not on file  Social Needs  . Financial resource strain: Not on file  . Food insecurity    Worry: Not on file    Inability: Not on file  . Transportation needs    Medical: Not on file    Non-medical: Not on file  Tobacco Use  . Smoking status: Current Every Day Smoker    Packs/day: 0.50    Years: 40.00    Pack years: 20.00  Substance and Sexual Activity  . Alcohol use: No    Alcohol/week: 0.0 standard drinks  . Drug use: Not on file  . Sexual activity: Yes  Lifestyle  . Physical activity    Days per week: Not on file    Minutes per session: Not on file  . Stress: Not on file  Relationships  . Social Herbalist on phone: Not on file    Gets together: Not on file    Attends religious service: Not on file    Active member of club or organization: Not on file    Attends meetings of clubs or organizations: Not on file    Relationship status: Not on file  . Intimate partner violence    Fear of current or ex partner: Not on file    Emotionally abused: Not on file    Physically abused: Not on file    Forced sexual activity: Not on file  Other Topics Concern  . Not on file  Social History Narrative  . Not on file    Past Surgical Hx:  Past Surgical History:  Procedure Laterality Date  . ABDOMINAL HYSTERECTOMY    . MENISCUS REPAIR    . MOUTH SURGERY      Past Medical Hx:  Past Medical History:  Diagnosis Date  . Anxiety   . Arthritis   . Diabetes mellitus without complication ([place])   . Fibroids   . Fibromyalgia 2004  . Fibromyalgia   . Hyperlipidemia   . Psoriasis     Oncology Hx:  Oncology History   No history exists.    Family Hx:  Family History  Problem Relation Age of Onset  . Mental illness Sister   . Kidney disease Sister   . Breast cancer Other   . Lung cancer Maternal Uncle 69  . Lung cancer Maternal Uncle 50    Vitals:  Blood pressure 123/66, pulse 93, temperature 98.7 F (37.1 C),  temperature source Temporal, resp. rate 16, height 5\' 4"  (1.626 m), weight 173 lb 8 oz (78.7 kg), SpO2 98 %.  Physical Exam: Well-nourished well-developed female in no acute distress.  Groins: No lymphadenopathy.  Pelvic: External genitalia notable for an asymmetry between the right and left vulva.  It appears  that the left labia minora has been surgically excised.  Just inside the right labia majora towards the vagina near the urethra there is a 1.5 x 0.5 cm ulcerative lesion consistent with a prior biopsy site.  There are no other lesions on the vulva.  Speculum examination reveals no other changes within the vagina.  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  I did address the asymmetry of the vulva with the patient.  She is not aware that she has had a vulvectomy at any time in her past but there clearly is a very different appearance to the right and left labia.  I cannot see a surgical incision on the left labia.  I believe she would be a good candidate for laser ablative therapy.  I believe this will provide [place] the best opportunity for treating the area without encroaching the urethra and allow her to maintain the caliber of her introitus without scarring.  She wishes to proceed.  The procedure was discussed with the patient and she understands it will be a short procedure but she will need someone to bring her.  Surgery is tentatively scheduled for September 17 with Dr. [name].  The patient's questions were elicited and answered to her satisfaction.  She knows that she can call me if she has any other questions.  Did discuss smoking cessation she feels like she is getting ready and would like to try she will address this with her primary care physician as she may be interested in Chantix.  We appreciate the opportunity to part in the care of this very pleasant  patient.  Angelyna Henderson A., MD 11/02/2018, 2:18 PM

## 2018-11-02 ENCOUNTER — Encounter: Payer: Self-pay | Admitting: Gynecologic Oncology

## 2018-11-02 ENCOUNTER — Inpatient Hospital Stay: Payer: 59 | Attending: Gynecologic Oncology | Admitting: Gynecologic Oncology

## 2018-11-02 ENCOUNTER — Other Ambulatory Visit: Payer: Self-pay

## 2018-11-02 VITALS — BP 123/66 | HR 93 | Temp 98.7°F | Resp 16 | Ht 64.0 in | Wt 173.5 lb

## 2018-11-02 DIAGNOSIS — Z791 Long term (current) use of non-steroidal anti-inflammatories (NSAID): Secondary | ICD-10-CM | POA: Insufficient documentation

## 2018-11-02 DIAGNOSIS — E119 Type 2 diabetes mellitus without complications: Secondary | ICD-10-CM | POA: Diagnosis not present

## 2018-11-02 DIAGNOSIS — N901 Moderate vulvar dysplasia: Secondary | ICD-10-CM | POA: Insufficient documentation

## 2018-11-02 DIAGNOSIS — Z79899 Other long term (current) drug therapy: Secondary | ICD-10-CM | POA: Insufficient documentation

## 2018-11-02 DIAGNOSIS — M797 Fibromyalgia: Secondary | ICD-10-CM | POA: Insufficient documentation

## 2018-11-02 DIAGNOSIS — Z7984 Long term (current) use of oral hypoglycemic drugs: Secondary | ICD-10-CM | POA: Diagnosis not present

## 2018-11-02 DIAGNOSIS — F17210 Nicotine dependence, cigarettes, uncomplicated: Secondary | ICD-10-CM | POA: Insufficient documentation

## 2018-11-02 DIAGNOSIS — A630 Anogenital (venereal) warts: Secondary | ICD-10-CM | POA: Diagnosis not present

## 2018-11-02 DIAGNOSIS — E785 Hyperlipidemia, unspecified: Secondary | ICD-10-CM | POA: Diagnosis not present

## 2018-11-02 DIAGNOSIS — M19041 Primary osteoarthritis, right hand: Secondary | ICD-10-CM | POA: Diagnosis not present

## 2018-11-02 NOTE — Patient Instructions (Signed)
Plan to have CO2 laser vaporization of the vulva at the [facility] with Dr. [name] on September 17 or sooner if a date opens.  You will receive a phone call from the pre-surgical RN to discuss instructions.  Please call for any questions or concerns.

## 2018-11-04 ENCOUNTER — Encounter: Payer: Self-pay | Admitting: Gynecologic Oncology

## 2018-11-09 ENCOUNTER — Telehealth: Payer: Self-pay

## 2018-11-09 NOTE — Telephone Encounter (Signed)
Told Karina Rice that her surgery had to be rescheduled to 12-09-18 with Dr. Skeet Latch as her schedule on 11-18-18 would not allow adding another surgery. Rescheduled post op appointment to 12-30-18 with Dr. Skeet Latch.

## 2018-12-01 ENCOUNTER — Encounter (HOSPITAL_BASED_OUTPATIENT_CLINIC_OR_DEPARTMENT_OTHER): Payer: Self-pay | Admitting: *Deleted

## 2018-12-01 ENCOUNTER — Other Ambulatory Visit: Payer: Self-pay

## 2018-12-01 NOTE — Progress Notes (Signed)
Spoke with patient via telephone for pre op interview. NPO after MN. Patient to take AM medications with a sip of water. Will need EKG AM of surgery. Arrival time 0600.

## 2018-12-06 ENCOUNTER — Other Ambulatory Visit (HOSPITAL_COMMUNITY)
Admission: RE | Admit: 2018-12-06 | Discharge: 2018-12-06 | Disposition: A | Discharge status: Home / Self Care | Payer: 59 | Source: Ambulatory Visit | Attending: Gynecologic Oncology | Admitting: Gynecologic Oncology

## 2018-12-06 DIAGNOSIS — Z01812 Encounter for preprocedural laboratory examination: Secondary | ICD-10-CM | POA: Diagnosis present

## 2018-12-06 DIAGNOSIS — Z20828 Contact with and (suspected) exposure to other viral communicable diseases: Secondary | ICD-10-CM | POA: Insufficient documentation

## 2018-12-07 LAB — NOVEL CORONAVIRUS, NAA (HOSP ORDER, SEND-OUT TO REF LAB; TAT 18-24 HRS): SARS-CoV-2, NAA: NOT DETECTED

## 2018-12-08 NOTE — H&P (Signed)
Pre op  Note: Gyn-Onc  Karina Rice 53 y.o. female  CC:     Chief Complaint  Patient presents with  . New Patient (Initial Visit)    HPI: Patient is seen today in consultation at the request of Dr. Jerelyn Charles.  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  It is for this reason that she is referred to [place] today.  She states about 2 months ago she began experiencing some dyspareunia that she thought was secondary to vaginal dryness but then just started noticing some burning on her vulva in general.  She examined herself 1 day in the shower and felt an area that was sore to touch so she sought an appointment.  She is a long history of HPV.  She states that she underwent cryo therapy 4 times with the last time being when she was 53 years old.  She subsequently underwent a hysterectomy 2011 because of irregular bleeding and "I want to do it".  She never took any hormone replacement therapy.  Her mammograms are up-to-date.  She is never had a colonoscopy.  She is a diabetic.  Her last hemoglobin A1c was less than 6 3 months ago.  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  Review of Systems Constitutional: Denies fever. Skin: + lesion vulva Cardiovascular: No chest pain, shortness of breath, or edema  Pulmonary: No cough  Gastro Intestinal: No nausea, vomiting, constipation, or diarrhea reported.  Genitourinary: Denies vaginal bleeding and discharge.  Musculoskeletal: + right hip and hand joint pain, + right SI joint pain   Current Meds:      Outpatient Encounter Medications as of 11/02/2018  Medication Sig  . albuterol (PROVENTIL HFA;VENTOLIN HFA) 108 (90 Base) MCG/ACT inhaler Inhale 2 puffs into the lungs every 6 (six) hours as needed for  wheezing or shortness of breath.  . ALPRAZolam (XANAX) 1 MG tablet Take 1 mg by mouth 3 (three) times daily as needed for anxiety.  Marland Kitchen buPROPion (WELLBUTRIN SR) 150 MG 12 hr tablet Take 150 mg by mouth 2 (two) times daily.  . cephALEXin (KEFLEX) 500 MG capsule Take 500 mg by mouth 4 (four) times daily.  . diclofenac (VOLTAREN) 75 MG EC tablet Take 75 mg by mouth 2 (two) times daily.  . DULoxetine (CYMBALTA) 60 MG capsule Take 60 mg by mouth daily.  Marland Kitchen estradiol (ESTRACE) 0.1 MG/GM vaginal cream Place 1 Applicatorful vaginally at bedtime.  . folic acid (FOLVITE) 1 MG tablet Take 1 mg by mouth daily.  Marland Kitchen losartan (COZAAR) 25 MG tablet Take 25 mg by mouth daily.  . metFORMIN (GLUCOPHAGE) 1000 MG tablet Take 1,000 mg by mouth 2 (two) times daily with a meal.  . montelukast (SINGULAIR) 10 MG tablet Take 10 mg by mouth at bedtime.  . pregabalin (LYRICA) 150 MG capsule Take 150 mg by mouth 3 (three) times daily.  . rosuvastatin (CRESTOR) 20 MG tablet Take 20 mg by mouth daily.  Marland Kitchen sulfamethoxazole-trimethoprim (BACTRIM DS) 800-160 MG tablet Take 1 tablet by mouth 2 (two) times daily.  Marland Kitchen Ustekinumab (STELARA) 45 MG/0.5ML SOLN Inject into the skin.  . [DISCONTINUED] chlorpheniramine-HYDROcodone (TUSSIONEX PENNKINETIC ER) 10-8 MG/5ML SUER Take 5 mLs by mouth 2 (two)  times daily. (Patient not taking: Reported on 11/01/2018)  . [DISCONTINUED] cyclobenzaprine (FLEXERIL) 10 MG tablet Take 10 mg by mouth 2 (two) times daily.  . [DISCONTINUED] diclofenac (VOLTAREN) 75 MG EC tablet Take 75 mg by mouth 2 (two) times daily.   No facility-administered encounter medications on file as of 11/02/2018.     Allergy:       Allergies  Allergen Reactions  . Aspirin Swelling  . Latex Hives       . Lisinopril     Abdominal pain    Social Hx:   Social History        Socioeconomic History  . Marital status: Married    Spouse name: Not on file  . Number of children: Not on file  . Years of education:  Not on file  . Highest education level: Not on file  Occupational History  . Not on file  Social Needs  . Financial resource strain: Not on file  . Food insecurity    Worry: Not on file    Inability: Not on file  . Transportation needs    Medical: Not on file    Non-medical: Not on file  Tobacco Use  . Smoking status: Current Every Day Smoker    Packs/day: 0.50    Years: 40.00    Pack years: 20.00  Substance and Sexual Activity  . Alcohol use: No    Alcohol/week: 0.0 standard drinks  . Drug use: Not on file  . Sexual activity: Yes  Lifestyle  . Physical activity    Days per week: Not on file    Minutes per session: Not on file  . Stress: Not on file  Relationships  . Social Herbalist on phone: Not on file    Gets together: Not on file    Attends religious service: Not on file    Active member of club or organization: Not on file    Attends meetings of clubs or organizations: Not on file    Relationship status: Not on file  . Intimate partner violence    Fear of current or ex partner: Not on file    Emotionally abused: Not on file    Physically abused: Not on file    Forced sexual activity: Not on file  Other Topics Concern  . Not on file  Social History Narrative  . Not on file    Past Surgical Hx:       Past Surgical History:  Procedure Laterality Date  . ABDOMINAL HYSTERECTOMY    . MENISCUS REPAIR    . MOUTH SURGERY      Past Medical Hx:      Past Medical History:  Diagnosis Date  . Anxiety   . Arthritis   . Diabetes mellitus without complication ([place])   . Fibroids   . Fibromyalgia 2004  . Fibromyalgia   . Hyperlipidemia   . Psoriasis     Oncology Hx:     Oncology History   No history exists.    Family Hx:       Family History  Problem Relation Age of Onset  . Mental illness Sister   . Kidney disease Sister   . Breast cancer Other   . Lung cancer Maternal Uncle  44  . Lung cancer Maternal Uncle 50    Vitals:  Blood pressure 123/66, pulse 93, temperature 98.7 F (37.1 C), temperature source Temporal, resp. rate 16, height 5\' 4"  (1.626 m), weight 173 lb 8  oz (78.7 kg), SpO2 98 %.  Physical Exam: Well-nourished well-developed female in no acute distress.  Groins: No lymphadenopathy.  Pelvic: External genitalia notable for an asymmetry between the right and left vulva.  It appears that the left labia minora has been surgically excised.  Just inside the right labia majora towards the vagina near the urethra there is a 1.5 x 0.5 cm ulcerative lesion consistent with a prior biopsy site.  There are no other lesions on the vulva.  Speculum examination reveals no other changes within the vagina.  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  I did address the asymmetry of the vulva with the patient.  She is not aware that she has had a vulvectomy at any time in her past but there clearly is a very different appearance to the right and left labia.  I cannot see a surgical incision on the left labia.  I believe she would be a good candidate for laser ablative therapy.  I believe this will provide [place] the best opportunity for treating the area without encroaching the urethra and allow her to maintain the caliber of her introitus without scarring.  She wishes to proceed.  The procedure was discussed with the patient and she understands it will be a short procedure but she will need someone to bring her.  Surgery is tentatively scheduled for December 09, 2018 with Dr. [name].

## 2018-12-09 ENCOUNTER — Ambulatory Visit: Payer: 59 | Admitting: Gynecologic Oncology

## 2018-12-09 ENCOUNTER — Other Ambulatory Visit: Payer: Self-pay

## 2018-12-09 ENCOUNTER — Encounter (HOSPITAL_BASED_OUTPATIENT_CLINIC_OR_DEPARTMENT_OTHER)
Admission: RE | Disposition: A | Discharge status: Home / Self Care | Payer: Self-pay | Source: Home / Self Care | Attending: Gynecologic Oncology

## 2018-12-09 ENCOUNTER — Encounter (HOSPITAL_BASED_OUTPATIENT_CLINIC_OR_DEPARTMENT_OTHER): Payer: Self-pay

## 2018-12-09 ENCOUNTER — Ambulatory Visit (HOSPITAL_BASED_OUTPATIENT_CLINIC_OR_DEPARTMENT_OTHER): Payer: 59 | Admitting: Anesthesiology

## 2018-12-09 ENCOUNTER — Ambulatory Visit (HOSPITAL_BASED_OUTPATIENT_CLINIC_OR_DEPARTMENT_OTHER)
Admission: RE | Admit: 2018-12-09 | Discharge: 2018-12-09 | Disposition: A | Discharge status: Home / Self Care | Payer: 59 | Attending: Gynecologic Oncology | Admitting: Gynecologic Oncology

## 2018-12-09 DIAGNOSIS — Z79890 Hormone replacement therapy (postmenopausal): Secondary | ICD-10-CM | POA: Insufficient documentation

## 2018-12-09 DIAGNOSIS — Z791 Long term (current) use of non-steroidal anti-inflammatories (NSAID): Secondary | ICD-10-CM | POA: Insufficient documentation

## 2018-12-09 DIAGNOSIS — D071 Carcinoma in situ of vulva: Secondary | ICD-10-CM | POA: Diagnosis not present

## 2018-12-09 DIAGNOSIS — Z515 Encounter for palliative care: Secondary | ICD-10-CM

## 2018-12-09 DIAGNOSIS — N903 Dysplasia of vulva, unspecified: Secondary | ICD-10-CM

## 2018-12-09 DIAGNOSIS — I10 Essential (primary) hypertension: Secondary | ICD-10-CM | POA: Diagnosis not present

## 2018-12-09 DIAGNOSIS — A630 Anogenital (venereal) warts: Secondary | ICD-10-CM

## 2018-12-09 DIAGNOSIS — M150 Primary generalized (osteo)arthritis: Secondary | ICD-10-CM | POA: Insufficient documentation

## 2018-12-09 DIAGNOSIS — Z7984 Long term (current) use of oral hypoglycemic drugs: Secondary | ICD-10-CM | POA: Insufficient documentation

## 2018-12-09 DIAGNOSIS — E785 Hyperlipidemia, unspecified: Secondary | ICD-10-CM | POA: Insufficient documentation

## 2018-12-09 DIAGNOSIS — Z79899 Other long term (current) drug therapy: Secondary | ICD-10-CM | POA: Insufficient documentation

## 2018-12-09 DIAGNOSIS — M797 Fibromyalgia: Secondary | ICD-10-CM | POA: Diagnosis not present

## 2018-12-09 DIAGNOSIS — F17210 Nicotine dependence, cigarettes, uncomplicated: Secondary | ICD-10-CM | POA: Diagnosis not present

## 2018-12-09 DIAGNOSIS — N901 Moderate vulvar dysplasia: Secondary | ICD-10-CM | POA: Diagnosis present

## 2018-12-09 DIAGNOSIS — F419 Anxiety disorder, unspecified: Secondary | ICD-10-CM | POA: Diagnosis not present

## 2018-12-09 DIAGNOSIS — E119 Type 2 diabetes mellitus without complications: Secondary | ICD-10-CM | POA: Insufficient documentation

## 2018-12-09 HISTORY — DX: Other specified disorders of teeth and supporting structures: K08.89

## 2018-12-09 HISTORY — PX: CO2 LASER APPLICATION: SHX5778

## 2018-12-09 HISTORY — DX: Essential (primary) hypertension: I10

## 2018-12-09 LAB — GLUCOSE, CAPILLARY
Glucose-Capillary: 92 mg/dL (ref 70–99)
Glucose-Capillary: 93 mg/dL (ref 70–99)

## 2018-12-09 SURGERY — CO2 LASER APPLICATION
Anesthesia: General | Site: Vagina

## 2018-12-09 MED ORDER — ACETAMINOPHEN 500 MG PO TABS
1000.0000 mg | ORAL_TABLET | Freq: Once | ORAL | Status: AC
  Administered 2018-12-09: 1000 mg via ORAL
  Filled 2018-12-09: qty 2

## 2018-12-09 MED ORDER — ACETAMINOPHEN 500 MG PO TABS
ORAL_TABLET | ORAL | Status: AC
  Filled 2018-12-09: qty 2

## 2018-12-09 MED ORDER — LACTATED RINGERS IV SOLN
INTRAVENOUS | Status: DC
  Administered 2018-12-09: 08:00:00 via INTRAVENOUS
  Filled 2018-12-09: qty 1000

## 2018-12-09 MED ORDER — OXYCODONE-ACETAMINOPHEN 5-325 MG PO TABS
ORAL_TABLET | ORAL | Status: AC
  Filled 2018-12-09: qty 1

## 2018-12-09 MED ORDER — SILVER SULFADIAZINE 1 % EX CREA
TOPICAL_CREAM | CUTANEOUS | Status: DC | PRN
  Administered 2018-12-09: 1 via TOPICAL

## 2018-12-09 MED ORDER — FENTANYL CITRATE (PF) 100 MCG/2ML IJ SOLN
INTRAMUSCULAR | Status: DC | PRN
  Administered 2018-12-09 (×2): 50 ug via INTRAVENOUS

## 2018-12-09 MED ORDER — OXYCODONE-ACETAMINOPHEN 5-325 MG PO TABS
1.0000 | ORAL_TABLET | Freq: Once | ORAL | Status: AC
  Administered 2018-12-09: 1 via ORAL
  Filled 2018-12-09: qty 1

## 2018-12-09 MED ORDER — ACETIC ACID 5 % SOLN
Status: DC | PRN
  Administered 2018-12-09: 1 via TOPICAL

## 2018-12-09 MED ORDER — HYDROMORPHONE HCL 1 MG/ML IJ SOLN
0.2500 mg | INTRAMUSCULAR | Status: DC | PRN
  Filled 2018-12-09: qty 0.5

## 2018-12-09 MED ORDER — PROPOFOL 10 MG/ML IV BOLUS
INTRAVENOUS | Status: AC
  Filled 2018-12-09: qty 40

## 2018-12-09 MED ORDER — FENTANYL CITRATE (PF) 100 MCG/2ML IJ SOLN
INTRAMUSCULAR | Status: AC
  Filled 2018-12-09: qty 2

## 2018-12-09 MED ORDER — OXYCODONE-ACETAMINOPHEN 5-325 MG PO TABS: 1.0000 | ORAL_TABLET | Freq: Four times a day (QID) | ORAL | 0 refills | Status: AC | PRN

## 2018-12-09 MED ORDER — DEXAMETHASONE SODIUM PHOSPHATE 10 MG/ML IJ SOLN
INTRAMUSCULAR | Status: DC | PRN
  Administered 2018-12-09: 4 mg via INTRAVENOUS

## 2018-12-09 MED ORDER — ALBUTEROL SULFATE HFA 108 (90 BASE) MCG/ACT IN AERS
INHALATION_SPRAY | RESPIRATORY_TRACT | Status: DC | PRN
  Administered 2018-12-09: 2 via RESPIRATORY_TRACT

## 2018-12-09 MED ORDER — LIDOCAINE 2% (20 MG/ML) 5 ML SYRINGE
INTRAMUSCULAR | Status: DC | PRN
  Administered 2018-12-09: 80 mg via INTRAVENOUS

## 2018-12-09 MED ORDER — KETOROLAC TROMETHAMINE 30 MG/ML IJ SOLN
INTRAMUSCULAR | Status: DC | PRN
  Administered 2018-12-09: 30 mg via INTRAVENOUS

## 2018-12-09 MED ORDER — DEXAMETHASONE SODIUM PHOSPHATE 10 MG/ML IJ SOLN
INTRAMUSCULAR | Status: AC
  Filled 2018-12-09: qty 1

## 2018-12-09 MED ORDER — PROPOFOL 10 MG/ML IV BOLUS
INTRAVENOUS | Status: DC | PRN
  Administered 2018-12-09: 150 mg via INTRAVENOUS

## 2018-12-09 MED ORDER — OXYCODONE-ACETAMINOPHEN 5-325 MG PO TABS: 1.0000 | ORAL_TABLET | ORAL | 0 refills | Status: AC | PRN

## 2018-12-09 MED ORDER — LIDOCAINE 2% (20 MG/ML) 5 ML SYRINGE
INTRAMUSCULAR | Status: AC
  Filled 2018-12-09: qty 5

## 2018-12-09 MED ORDER — MIDAZOLAM HCL 5 MG/5ML IJ SOLN
INTRAMUSCULAR | Status: DC | PRN
  Administered 2018-12-09: 2 mg via INTRAVENOUS

## 2018-12-09 MED ORDER — MIDAZOLAM HCL 2 MG/2ML IJ SOLN
INTRAMUSCULAR | Status: AC
  Filled 2018-12-09: qty 2

## 2018-12-09 MED ORDER — ONDANSETRON HCL 4 MG/2ML IJ SOLN
INTRAMUSCULAR | Status: DC | PRN
  Administered 2018-12-09: 4 mg via INTRAVENOUS

## 2018-12-09 MED ORDER — ONDANSETRON HCL 4 MG/2ML IJ SOLN
INTRAMUSCULAR | Status: AC
  Filled 2018-12-09: qty 2

## 2018-12-09 SURGICAL SUPPLY — 49 items
APPLICATOR COTTON TIP 6IN STRL (MISCELLANEOUS) IMPLANT
BAG DECANTER FOR FLEXI CONT (MISCELLANEOUS) ×3 IMPLANT
BLADE SURG 11 STRL SS (BLADE) IMPLANT
BLADE SURG 15 STRL LF DISP TIS (BLADE) IMPLANT
BLADE SURG 15 STRL SS (BLADE)
CANISTER SUCT 3000ML PPV (MISCELLANEOUS) IMPLANT
CANISTER SUCTION 1200CC (MISCELLANEOUS) IMPLANT
CATH ROBINSON RED A/P 16FR (CATHETERS) IMPLANT
CATH SILICONE 16FRX5CC (CATHETERS) ×2 IMPLANT
COVER WAND RF STERILE (DRAPES) ×3 IMPLANT
DEPRESSOR TONGUE BLADE STERILE (MISCELLANEOUS) ×6 IMPLANT
ELECT BALL LEEP 3MM BLK (ELECTRODE) IMPLANT
ELECT BALL LEEP 5MM RED (ELECTRODE) IMPLANT
ELECT NDL TIP 2.8 STRL (NEEDLE) IMPLANT
ELECT NEEDLE TIP 2.8 STRL (NEEDLE) IMPLANT
ELECT REM PT RETURN 9FT ADLT (ELECTROSURGICAL) ×3
ELECTRODE REM PT RTRN 9FT ADLT (ELECTROSURGICAL) IMPLANT
GAUZE SPONGE 4X4 12PLY STRL LF (GAUZE/BANDAGES/DRESSINGS) ×2 IMPLANT
GLOVE BIOGEL PI IND STRL 6.5 (GLOVE) IMPLANT
GLOVE BIOGEL PI IND STRL 7.5 (GLOVE) IMPLANT
GLOVE BIOGEL PI INDICATOR 6.5 (GLOVE) ×4
GLOVE BIOGEL PI INDICATOR 7.5 (GLOVE) ×2
GLOVE ECLIPSE 7.5 STRL STRAW (GLOVE) ×3 IMPLANT
GLOVE ECLIPSE 8.0 STRL XLNG CF (GLOVE) ×3 IMPLANT
GOWN STRL REUS W/ TWL LRG LVL3 (GOWN DISPOSABLE) IMPLANT
GOWN STRL REUS W/ TWL XL LVL3 (GOWN DISPOSABLE) ×1 IMPLANT
GOWN STRL REUS W/TWL LRG LVL3 (GOWN DISPOSABLE) ×3
GOWN STRL REUS W/TWL XL LVL3 (GOWN DISPOSABLE) ×2
KIT TURNOVER CYSTO (KITS) ×3 IMPLANT
NDL SAFETY ECLIPSE 18X1.5 (NEEDLE) IMPLANT
NDL SPNL 22GX3.5 QUINCKE BK (NEEDLE) IMPLANT
NEEDLE HYPO 18GX1.5 SHARP (NEEDLE)
NEEDLE HYPO 22GX1.5 SAFETY (NEEDLE) IMPLANT
NEEDLE SPNL 22GX3.5 QUINCKE BK (NEEDLE) IMPLANT
NS IRRIG 500ML POUR BTL (IV SOLUTION) IMPLANT
PACK COOL COMFORT PERI COLD (SET/KITS/TRAYS/PACK) ×2 IMPLANT
PACK VAGINAL WOMENS (CUSTOM PROCEDURE TRAY) ×3 IMPLANT
PAD OB MATERNITY 4.3X12.25 (PERSONAL CARE ITEMS) ×1 IMPLANT
PAD PREP 24X48 CUFFED NSTRL (MISCELLANEOUS) ×3 IMPLANT
PUNCH BIOPSY DERMAL 4MM (INSTRUMENTS) ×2 IMPLANT
SCOPETTES 8  STERILE (MISCELLANEOUS)
SCOPETTES 8 STERILE (MISCELLANEOUS) IMPLANT
SUT VIC AB 2-0 SH 27 (SUTURE) ×6
SUT VIC AB 2-0 SH 27XBRD (SUTURE) ×1 IMPLANT
SUT VICRYL 3 0 UR 6 27 (SUTURE) IMPLANT
TOWEL OR 17X26 10 PK STRL BLUE (TOWEL DISPOSABLE) ×3 IMPLANT
VACUUM HOSE 7/8X10 W/ WAND (MISCELLANEOUS) IMPLANT
VACUUM HOSE/TUBING 7/8INX6FT (MISCELLANEOUS) ×2 IMPLANT
WATER STERILE IRR 500ML POUR (IV SOLUTION) ×3 IMPLANT

## 2018-12-09 NOTE — Transfer of Care (Signed)
Immediate Anesthesia Transfer of Care Note  Patient: Karina Rice  Procedure(s) Performed: CO2 LASER APPLICATION OF VULVA, RIGHT VULVA BIOPSY (N/A Vagina )  Patient Location: PACU  Anesthesia Type:General  Level of Consciousness: awake, alert  and oriented  Airway & Oxygen Therapy: Patient Spontanous Breathing and Patient connected to nasal cannula oxygen  Post-op Assessment: Report given to RN  Post vital signs: Reviewed and stable  Last Vitals:  Vitals Value Taken Time  BP 126/78 12/09/18 1000  Temp    Pulse 78 12/09/18 1002  Resp 21 12/09/18 1002  SpO2 95 % 12/09/18 1002  Vitals shown include unvalidated device data.  Last Pain:  Vitals:   12/09/18 0755  TempSrc: Oral  PainSc: 2       Patients Stated Pain Goal: 5 ([id number])  Complications: No apparent anesthesia complications

## 2018-12-09 NOTE — Op Note (Signed)
OPERATIVE NOTE DATE:   Preoperative Diagnosis: VIN/VAIN III  Postoperative Diagnosis: VIN III/VAIN III  Procedure(s) Performed: Extensive CO2 laser of the medial aspect ot the right labia miora for 3.5 minutes Vulvar biopsy   Surgeon: Francetta Found.  Skeet Latch, M.D. PhD  Anesthesia: LMA   Operative Findings: there was a 1.5x2 cm lesion on the inner right labia with extension to the lateral right aspect of the urethra.and lateral vaginal wall.  The mid portion was recessed with rolled/heaped edges.  With application of acetic acid to the external genitalia, no other acetic acid changes were appreciated.   Procedure:Karina Rice  was taken to the OR and timeout performed.  She was then placed under general anesthesia and her feet placed in the dorsal lithotomy position.  Acetic acid was placed over the external genitalia from the mons to the buttock cheeks and findings were as described above.  She was then prepped and draped in usual sterile fashion.A second time out was performed.  The operative site was bordered with wet towels.   58mm punch biopsy x 2 were collected from the heaped edges of the lesion with bleeding that was only controlled with suture.  Acetic acid was once again applied.  A margin of 8 to 10 mm was obtained surrounding the area with acetowhite changes.  CO2 laser was applied using a spot size of 8 mm, continuous pulse, to a depth of the third surgical plane for 3.5 min. With a margin of 23mm.   Hemostasis was obtained.  The perative field was irrigated,  Silvadene cream was applied to the surgical site.    Specimens: vulvar biopsy Estimated Blood Loss: 15*ml  Sponge, lap and needle counts were correct x 3.    Complications: None  The patient had sequential compression devices for VTE prophylaxis    Disposition: PACU - hemodynamically stable.         Condition: stable

## 2018-12-09 NOTE — Anesthesia Preprocedure Evaluation (Addendum)
Anesthesia Evaluation  Patient identified by MRN, date of birth, ID band Patient awake    Reviewed: Allergy & Precautions, H&P , NPO status , Patient's Chart, lab work & pertinent test results  Airway Mallampati: III  TM Distance: >3 FB Neck ROM: Full    Dental no notable dental hx. (+) Poor Dentition, Loose   Pulmonary Current SmokerPatient did not abstain from smoking.,    Pulmonary exam normal breath sounds clear to auscultation       Cardiovascular hypertension,  Rhythm:Regular Rate:Normal     Neuro/Psych Anxiety negative neurological ROS     GI/Hepatic negative GI ROS, Neg liver ROS,   Endo/Other  diabetes, Type 2, Oral Hypoglycemic Agents  Renal/GU negative Renal ROS  negative genitourinary   Musculoskeletal  (+) Arthritis , Fibromyalgia -  Abdominal   Peds  Hematology negative hematology ROS (+)   Anesthesia Other Findings   Reproductive/Obstetrics negative OB ROS                            Anesthesia Physical Anesthesia Plan  ASA: II  Anesthesia Plan: General   Post-op Pain Management:    Induction: Intravenous  PONV Risk Score and Plan: 3 and Ondansetron, Dexamethasone and Midazolam  Airway Management Planned: LMA  Additional Equipment:   Intra-op Plan:   Post-operative Plan: Extubation in OR  Informed Consent: I have reviewed the patients History and Physical, chart, labs and discussed the procedure including the risks, benefits and alternatives for the proposed anesthesia with the patient or authorized representative who has indicated his/her understanding and acceptance.     Dental advisory given  Plan Discussed with: CRNA  Anesthesia Plan Comments:         Anesthesia Quick Evaluation

## 2018-12-09 NOTE — Anesthesia Procedure Notes (Signed)
Procedure Name: General with mask airway Date/Time: 12/09/2018 9:00 AM Performed by: Bonney Aid, CRNA Pre-anesthesia Checklist: Patient identified Patient Re-evaluated:Patient Re-evaluated prior to induction Oxygen Delivery Method: Circle system utilized Preoxygenation: Pre-oxygenation with 100% oxygen Induction Type: IV induction Ventilation: Mask ventilation without difficulty Placement Confirmation: positive ETCO2 Dental Injury: Teeth and Oropharynx as per pre-operative assessment  Comments: R lateral incisor extremely loose.  Easy mask. No lma placed

## 2018-12-09 NOTE — Interval H&P Note (Signed)
History and Physical Interval Note:  12/09/2018 8:58 AM  Karina Rice  has presented today for surgery, with the diagnosis of VULVAR DYSPLASIA.  The various methods of treatment have been discussed with the patient and family. After consideration of risks, benefits and other options for treatment, the patient has consented to  Procedure(s): CO2 LASER APPLICATION OF VULVA (N/A) as a surgical intervention.  The patient's history has been reviewed, patient examined, no change in status, stable for surgery.  I have reviewed the patient's chart and labs.  Questions were answered to the patient's satisfaction.     [name]

## 2018-12-09 NOTE — Discharge Instructions (Signed)
° ° ° ° °  Post Anesthesia Home Care Instructions  Activity: Get plenty of rest for the remainder of the day. A responsible individual must stay with you for 24 hours following the procedure.  For the next 24 hours, DO NOT: -Drive a car -Paediatric nurse -Drink alcoholic beverages -Take any medication unless instructed by your physician -Make any legal decisions or sign important papers.  Meals: Start with liquid foods such as gelatin or soup. Progress to regular foods as tolerated. Avoid greasy, spicy, heavy foods. If nausea and/or vomiting occur, drink only clear liquids until the nausea and/or vomiting subsides. Call your physician if vomiting continues.  Special Instructions/Symptoms: Your throat may feel dry or sore from the anesthesia or the breathing tube placed in your throat during surgery. If this causes discomfort, gargle with warm salt water. The discomfort should disappear within 24 hours.  If you had a scopolamine patch placed behind your ear for the management of post- operative nausea and/or vomiting:  1. The medication in the patch is effective for 72 hours, after which it should be removed.  Wrap patch in a tissue and discard in the trash. Wash hands thoroughly with soap and water. 2. You may remove the patch earlier than 72 hours if you experience unpleasant side effects which may include dry mouth, dizziness or visual disturbances. 3. Avoid touching the patch. Wash your hands with soap and water after contact with the patch.  Do not take any Tylenol until after 2:15 pm today.

## 2018-12-09 NOTE — Anesthesia Postprocedure Evaluation (Signed)
Anesthesia Post Note  Patient: Karina Rice  Procedure(s) Performed: CO2 LASER APPLICATION OF VULVA, RIGHT VULVA BIOPSY (N/A Vagina )     Patient location during evaluation: PACU Anesthesia Type: General Level of consciousness: awake and alert Pain management: pain level controlled Vital Signs Assessment: post-procedure vital signs reviewed and stable Respiratory status: spontaneous breathing, nonlabored ventilation and respiratory function stable Cardiovascular status: blood pressure returned to baseline and stable Postop Assessment: no apparent nausea or vomiting Anesthetic complications: no    Last Vitals:  Vitals:   12/09/18 1015 12/09/18 1021  BP: 113/70   Pulse: 71 78  Resp: 11 13  Temp:    SpO2: 93% 97%    Last Pain:  Vitals:   12/09/18 0755  TempSrc: Oral  PainSc: 2                  Raeleigh Guinn,W. EDMOND

## 2018-12-09 NOTE — Interval H&P Note (Signed)
History and Physical Interval Note:  12/09/2018 8:57 AM  Karina Rice  has presented today for surgery, with the diagnosis of VULVAR DYSPLASIA.  The various methods of treatment have been discussed with the patient and family. After consideration of risks, benefits and other options for treatment, the patient has consented to  Procedure(s): CO2 LASER APPLICATION OF VULVA (N/A) as a surgical intervention.  The patient's history has been reviewed, patient examined, no change in status, stable for surgery.  I have reviewed the patient's chart and labs.  Questions were answered to the patient's satisfaction.     [name]

## 2018-12-10 ENCOUNTER — Telehealth: Payer: Self-pay

## 2018-12-10 ENCOUNTER — Encounter (HOSPITAL_BASED_OUTPATIENT_CLINIC_OR_DEPARTMENT_OTHER): Payer: Self-pay | Admitting: Gynecologic Oncology

## 2018-12-10 LAB — SURGICAL PATHOLOGY

## 2018-12-10 NOTE — Telephone Encounter (Signed)
LM for patient to call back to discuss how she is doing after her procedure yesterday.

## 2018-12-29 NOTE — Progress Notes (Signed)
GYN ONCOLOGY OFFICE VISIT  Referring MD  Dr. Jerelyn Charles.  Karina Rice 53 y.o. 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  It is for this reason that she is referred to [place] today.  She states about 2 months ago she began experiencing some dyspareunia that she thought was secondary to vaginal dryness but then just started noticing some burning on her vulva in general.  She examined herself 1 day in the shower and felt an area that was sore to touch so she sought an appointment.  She is a long history of HPV.  She states that she underwent cryo therapy 4 times with the last time being when she was 53 years old.  She subsequently underwent a hysterectomy 2011 because of irregular bleeding  Co2 laser, WLE 11/2018  Path A. 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 Skin: + lesion vulva Cardiovascular: No chest pain, shortness of breath, or edema  Pulmonary: No cough  Gastro Intestinal: No nausea, vomiting, constipation, or diarrhea reported.  Genitourinary: Denies vaginal bleeding and discharge.  Musculoskeletal: + right hip and hand joint pain, + right SI joint pain   Current Meds:  Outpatient Encounter Medications as of 12/30/2018  Medication Sig  . albuterol (PROVENTIL HFA;VENTOLIN HFA) 108 (90 Base) MCG/ACT inhaler Inhale 2 puffs into the lungs every 6 (six) hours as needed for wheezing or shortness of breath.  . ALPRAZolam (XANAX) 1 MG tablet Take 1 mg by mouth 3 (three) times daily as needed for anxiety.  Marland Kitchen buPROPion (WELLBUTRIN SR) 150 MG 12 hr tablet Take 150 mg by mouth 2 (two) times daily.  . diclofenac (VOLTAREN) 75 MG EC tablet Take 75 mg by  mouth 2 (two) times daily.  . DULoxetine (CYMBALTA) 60 MG capsule Take 60 mg by mouth daily.  Marland Kitchen estradiol (ESTRACE) 0.1 MG/GM vaginal cream Place 1 Applicatorful vaginally at bedtime.  Marland Kitchen losartan (COZAAR) 25 MG tablet Take 25 mg by mouth daily.  . metFORMIN (GLUCOPHAGE) 1000 MG tablet Take 1,000 mg by mouth 2 (two) times daily with a meal.  . montelukast (SINGULAIR) 10 MG tablet Take 10 mg by mouth at bedtime.  Marland Kitchen oxyCODONE-acetaminophen (PERCOCET/ROXICET) 5-325 MG tablet Take 1 tablet by mouth every 6 (six) hours as needed for severe pain.  Marland Kitchen oxyCODONE-acetaminophen (PERCOCET/ROXICET) 5-325 MG tablet Take 1 tablet by mouth every 4 (four) hours as needed for up to 3 doses.  . pregabalin (LYRICA) 150 MG capsule Take 150 mg by mouth 3 (three) times daily.  . rosuvastatin (CRESTOR) 20 MG tablet Take 20 mg by mouth daily.  Marland Kitchen Ustekinumab (STELARA) 45 MG/0.5ML SOLN Inject into the skin.   No facility-administered encounter medications on file as of 12/30/2018.     Allergy:  Allergies  Allergen Reactions  . Aspirin Swelling  . Latex Hives       . Lisinopril     Abdominal pain    Social Hx:   Social History   Socioeconomic History  . Marital status: Married    Spouse name: Not on file  . Number of children: Not on file  . Years of education: Not  on file  . Highest education level: Not on file  Occupational History  . Not on file  Social Needs  . Financial resource strain: Not on file  . Food insecurity    Worry: Not on file    Inability: Not on file  . Transportation needs    Medical: Not on file    Non-medical: Not on file  Tobacco Use  . Smoking status: Current Every Day Smoker    Packs/day: 0.50    Years: 40.00    Pack years: 20.00  . Smokeless tobacco: Never Used  Substance and Sexual Activity  . Alcohol use: No    Alcohol/week: 0.0 standard drinks  . Drug use: Never  . Sexual activity: Yes    Birth control/protection: Surgical  Lifestyle  . Physical activity     Days per week: Not on file    Minutes per session: Not on file  . Stress: Not on file  Relationships  . Social Herbalist on phone: Not on file    Gets together: Not on file    Attends religious service: Not on file    Active member of club or organization: Not on file    Attends meetings of clubs or organizations: Not on file    Relationship status: Not on file  . Intimate partner violence    Fear of current or ex partner: Not on file    Emotionally abused: Not on file    Physically abused: Not on file    Forced sexual activity: Not on file  Other Topics Concern  . Not on file  Social History Narrative  . Not on file    Past Surgical Hx:  Past Surgical History:  Procedure Laterality Date  . ABDOMINAL HYSTERECTOMY    . CO2 LASER APPLICATION N/A [id number]   Procedure: CO2 LASER APPLICATION OF VULVA, RIGHT VULVA BIOPSY;  Surgeon: [name], MD;  Location: [facility];  Service: Gynecology;  Laterality: N/A;  . MENISCUS REPAIR    . MOUTH SURGERY      Past Medical Hx:  Past Medical History:  Diagnosis Date  . Anxiety   . Arthritis   . Diabetes mellitus without complication ([place])   . Fibroids   . Fibromyalgia 2004  . Fibromyalgia   . Hyperlipidemia   . Hypertension   . Loose, teeth   . Psoriasis     Oncology Hx:  Oncology History   No history exists.    Family Hx:  Family History  Problem Relation Age of Onset  . Mental illness Sister   . Kidney disease Sister   . Breast cancer Other   . Lung cancer Maternal Uncle 76  . Lung cancer Maternal Uncle 50    Vitals:  Blood pressure (!) 119/57, pulse 91, temperature 98 F (36.7 C), temperature source Temporal, resp. rate 18, height 5\' 4"  (1.626 m), weight 170 lb (77.1 kg), SpO2 97 %.  Physical Exam: Well-nourished well-developed female in no acute distress.  Groins: No lymphadenopathy.  Pelvic: External genitalia notable for changes c/w co2 laser.  Area healing  well.   Assessment/Plan: 53 year old with VIN 23 extending into the distal vagina.  S/O Co2 laser WLE C/W VINIII  F/U in 6 months with Dr. Berline Lopes.

## 2018-12-30 ENCOUNTER — Other Ambulatory Visit: Payer: Self-pay

## 2018-12-30 ENCOUNTER — Inpatient Hospital Stay: Payer: 59 | Attending: Gynecologic Oncology | Admitting: Gynecologic Oncology

## 2018-12-30 VITALS — BP 119/57 | HR 91 | Temp 98.0°F | Resp 18 | Ht 64.0 in | Wt 170.0 lb

## 2018-12-30 DIAGNOSIS — E119 Type 2 diabetes mellitus without complications: Secondary | ICD-10-CM | POA: Insufficient documentation

## 2018-12-30 DIAGNOSIS — F419 Anxiety disorder, unspecified: Secondary | ICD-10-CM | POA: Insufficient documentation

## 2018-12-30 DIAGNOSIS — D071 Carcinoma in situ of vulva: Secondary | ICD-10-CM | POA: Insufficient documentation

## 2018-12-30 DIAGNOSIS — Z79899 Other long term (current) drug therapy: Secondary | ICD-10-CM | POA: Insufficient documentation

## 2018-12-30 DIAGNOSIS — F17210 Nicotine dependence, cigarettes, uncomplicated: Secondary | ICD-10-CM | POA: Insufficient documentation

## 2018-12-30 DIAGNOSIS — I10 Essential (primary) hypertension: Secondary | ICD-10-CM | POA: Insufficient documentation

## 2018-12-30 DIAGNOSIS — Z7984 Long term (current) use of oral hypoglycemic drugs: Secondary | ICD-10-CM | POA: Insufficient documentation

## 2018-12-30 DIAGNOSIS — N901 Moderate vulvar dysplasia: Secondary | ICD-10-CM

## 2018-12-30 DIAGNOSIS — M797 Fibromyalgia: Secondary | ICD-10-CM | POA: Insufficient documentation

## 2018-12-30 DIAGNOSIS — Z791 Long term (current) use of non-steroidal anti-inflammatories (NSAID): Secondary | ICD-10-CM | POA: Diagnosis not present

## 2018-12-30 DIAGNOSIS — M1990 Unspecified osteoarthritis, unspecified site: Secondary | ICD-10-CM | POA: Diagnosis not present

## 2018-12-30 DIAGNOSIS — E785 Hyperlipidemia, unspecified: Secondary | ICD-10-CM | POA: Insufficient documentation

## 2018-12-30 NOTE — Patient Instructions (Signed)
Follow up in six months or sooner if needed.

## 2024-02-01 ENCOUNTER — Telehealth (HOSPITAL_BASED_OUTPATIENT_CLINIC_OR_DEPARTMENT_OTHER): Payer: Self-pay | Admitting: Family Medicine

## 2024-02-01 NOTE — Telephone Encounter (Signed)
 Copied from CRM #[id number]. Topic: Appointments - Transfer of Care >> Feb 01, 2024  1:45 PM Ahlexyia S wrote: Pt is requesting to transfer FROM: Almarie Oneil BROCKS MD Pt is requesting to transfer TO: Dottie Rush MD Reason for requested transfer: Dr. Almarie is retiring. It is the responsibility of the team the patient would like to transfer to (Dr. Dottie) to reach out to the patient if for any reason this transfer is not acceptable.

## 2024-02-10 ENCOUNTER — Ambulatory Visit (HOSPITAL_BASED_OUTPATIENT_CLINIC_OR_DEPARTMENT_OTHER): Admitting: Family Medicine

## 2024-02-16 ENCOUNTER — Encounter (HOSPITAL_BASED_OUTPATIENT_CLINIC_OR_DEPARTMENT_OTHER): Payer: Self-pay | Admitting: Family Medicine

## 2024-02-16 ENCOUNTER — Ambulatory Visit (INDEPENDENT_AMBULATORY_CARE_PROVIDER_SITE_OTHER): Admitting: Family Medicine

## 2024-02-16 VITALS — BP 134/73 | HR 85 | Temp 98.5°F | Resp 16 | Ht 63.39 in | Wt 167.2 lb

## 2024-02-16 DIAGNOSIS — M1991 Primary osteoarthritis, unspecified site: Secondary | ICD-10-CM

## 2024-02-16 DIAGNOSIS — E785 Hyperlipidemia, unspecified: Secondary | ICD-10-CM

## 2024-02-16 DIAGNOSIS — F419 Anxiety disorder, unspecified: Secondary | ICD-10-CM

## 2024-02-16 DIAGNOSIS — E1140 Type 2 diabetes mellitus with diabetic neuropathy, unspecified: Secondary | ICD-10-CM

## 2024-02-16 DIAGNOSIS — I10 Essential (primary) hypertension: Secondary | ICD-10-CM

## 2024-02-16 MED ORDER — DICLOFENAC SODIUM 75 MG PO TBEC: 75.0000 mg | DELAYED_RELEASE_TABLET | Freq: Two times a day (BID) | ORAL | 0 refills | Status: AC

## 2024-02-16 MED ORDER — ALPRAZOLAM 1 MG PO TABS: 1.0000 mg | ORAL_TABLET | Freq: Two times a day (BID) | ORAL | 0 refills | Status: AC | PRN

## 2024-02-16 MED ORDER — DULOXETINE HCL 60 MG PO CPEP: 60.0000 mg | ORAL_CAPSULE | Freq: Every day | ORAL | 0 refills | Status: AC

## 2024-02-16 MED ORDER — LOSARTAN POTASSIUM 25 MG PO TABS: 25.0000 mg | ORAL_TABLET | Freq: Every day | ORAL | 0 refills | Status: AC

## 2024-02-16 MED ORDER — PREGABALIN 200 MG PO CAPS: 200.0000 mg | ORAL_CAPSULE | Freq: Two times a day (BID) | ORAL | 0 refills | Status: AC

## 2024-02-16 MED ORDER — ROSUVASTATIN CALCIUM 20 MG PO TABS: 20.0000 mg | ORAL_TABLET | Freq: Every day | ORAL | 0 refills | Status: AC

## 2024-02-16 MED ORDER — METFORMIN HCL 1000 MG PO TABS: 1000.0000 mg | ORAL_TABLET | Freq: Two times a day (BID) | ORAL | 0 refills | Status: AC

## 2024-02-16 NOTE — Progress Notes (Unsigned)
 New Patient Office Visit  Subjective    Patient ID: Karina Rice, female    DOB: [birth year]  Age: 58 y.o. MRN: [id number]  CC: No chief complaint on file.   HPI Karina Rice presents to establish care Discussed the use of AI scribe software for clinical note transcription with the patient, who gave verbal consent to proceed.  History of Present Illness   Karina Rice is a 58 year old female who presents for medication refills and establishment of care.  She is transitioning from her retired long-term provider and needs refills for current medications, which are at varying supply. She prefers 90-day refills when appropriate.  She takes Lyrica  (pregabalin ) twice daily, sometimes three times daily as needed, and is requesting ongoing refills. She also takes Xanax , originally started by a psychiatrist and then managed by her prior PCP, and understands that documentation from prior prescribers is required to continue this with a new provider.  She has plaque psoriasis treated with methotrexate prescribed by dermatology. She had shingles in early October, which was treated by dermatology and is now resolved without need for further care today.  She had blood work about two months ago and was told her A1c needed checking.  She lives with her husband and helps care for her niece with autism and her great nephew at home.      History of Present Illness     Outpatient Encounter Medications as of 02/16/2024  Medication Sig   albuterol  (PROVENTIL  HFA;VENTOLIN  HFA) 108 (90 Base) MCG/ACT inhaler Inhale 2 puffs into the lungs every 6 (six) hours as needed for wheezing or shortness of breath.   buPROPion (WELLBUTRIN SR) 150 MG 12 hr tablet Take 150 mg by mouth 2 (two) times daily.   buPROPion (WELLBUTRIN XL) 150 MG 24 hr tablet Take 150 mg by mouth daily.   folic acid (FOLVITE) 1 MG tablet Take by mouth.   valACYclovir (VALTREX) 1000 MG tablet Take 1,000 mg by mouth 3 (three) times  daily as needed.   [DISCONTINUED] ALPRAZolam  (XANAX ) 1 MG tablet Take 1 mg by mouth 3 (three) times daily as needed for anxiety.   [DISCONTINUED] diclofenac  (VOLTAREN ) 75 MG EC tablet Take 75 mg by mouth 2 (two) times daily.   [DISCONTINUED] DULoxetine  (CYMBALTA ) 60 MG capsule Take 60 mg by mouth daily.   [DISCONTINUED] losartan  (COZAAR ) 25 MG tablet Take 25 mg by mouth daily.   [DISCONTINUED] metFORMIN  (GLUCOPHAGE ) 1000 MG tablet Take 1,000 mg by mouth 2 (two) times daily with a meal.   [DISCONTINUED] pregabalin  (LYRICA ) 150 MG capsule Take 150 mg by mouth 3 (three) times daily.   [DISCONTINUED] pregabalin  (LYRICA ) 200 MG capsule Take 200 mg by mouth 3 (three) times daily as needed.   [DISCONTINUED] rosuvastatin  (CRESTOR ) 20 MG tablet Take 20 mg by mouth daily.   ALPRAZolam  (XANAX ) 1 MG tablet Take 1 tablet (1 mg total) by mouth 2 (two) times daily as needed for anxiety.   diclofenac  (VOLTAREN ) 75 MG EC tablet Take 1 tablet (75 mg total) by mouth 2 (two) times daily.   DULoxetine  (CYMBALTA ) 60 MG capsule Take 1 capsule (60 mg total) by mouth daily.   estradiol (ESTRACE) 0.1 MG/GM vaginal cream Place 1 Applicatorful vaginally at bedtime. (Patient not taking: Reported on 02/16/2024)   losartan  (COZAAR ) 25 MG tablet Take 1 tablet (25 mg total) by mouth daily.   metFORMIN  (GLUCOPHAGE ) 1000 MG tablet Take 1 tablet (1,000 mg total) by mouth 2 (two) times daily  with a meal.   montelukast (SINGULAIR) 10 MG tablet Take 10 mg by mouth at bedtime. (Patient not taking: Reported on 02/16/2024)   oxyCODONE -acetaminophen  (PERCOCET/ROXICET) 5-325 MG tablet Take 1 tablet by mouth every 6 (six) hours as needed for severe pain. (Patient not taking: Reported on 02/16/2024)   oxyCODONE -acetaminophen  (PERCOCET/ROXICET) 5-325 MG tablet Take 1 tablet by mouth every 4 (four) hours as needed for up to 3 doses. (Patient not taking: Reported on 02/16/2024)   pregabalin  (LYRICA ) 200 MG capsule Take 1 capsule (200 mg total) by  mouth 2 (two) times daily.   rosuvastatin  (CRESTOR ) 20 MG tablet Take 1 tablet (20 mg total) by mouth daily.   Ustekinumab (STELARA) 45 MG/0.5ML SOLN Inject into the skin. (Patient not taking: Reported on 02/16/2024)   No facility-administered encounter medications on file as of 02/16/2024.    Past Medical History:  Diagnosis Date   Anxiety    Diabetes mellitus without complication (HCC)    Fibroids    Fibromyalgia 2004   Hyperlipidemia    Hypertension    Psoriasis    f/by [facility]    Past Surgical History:  Procedure Laterality Date   ABDOMINAL HYSTERECTOMY     CO2 LASER APPLICATION N/A 12/09/2018   Procedure: CO2 LASER APPLICATION OF VULVA, RIGHT VULVA BIOPSY;  Surgeon: Jerrol Harvey, MD;  Location: [facility];  Service: Gynecology;  Laterality: N/A;   MENISCUS REPAIR     MOUTH SURGERY      Family History  Problem Relation Age of Onset   Mental illness Sister    Kidney disease Sister    Breast cancer Other    Lung cancer Maternal Uncle 50   Lung cancer Maternal Uncle 63    Social History[1]  Review of Systems  Constitutional:  Positive for malaise/fatigue. Negative for weight loss.  Cardiovascular:  Negative for chest pain and palpitations.  Neurological:  Negative for dizziness.  Psychiatric/Behavioral:  Negative for depression, hallucinations, memory loss, substance abuse and suicidal ideas. The patient is nervous/anxious. The patient does not have insomnia.         Objective    BP 134/73 (Cuff Size: Normal)   Pulse 85   Temp 98.5 F (36.9 C) (Oral)   Resp 16   Ht 5' 3.39 (1.61 m)   Wt 167 lb 3.2 oz (75.8 kg)   SpO2 93%   BMI 29.26 kg/m   Physical Exam Constitutional:      General: She is not in acute distress.    Appearance: Normal appearance.     Comments: Overweight, pleasant.  HENT:     Head: Normocephalic.  Neck:     Vascular: No carotid bruit.  Cardiovascular:     Rate and Rhythm: Normal rate and regular  rhythm.     Pulses: Normal pulses.     Heart sounds: Normal heart sounds.  Pulmonary:     Effort: Pulmonary effort is normal.     Breath sounds: Normal breath sounds.  Abdominal:     General: Bowel sounds are normal.     Palpations: Abdomen is soft.  Musculoskeletal:     Cervical back: Neck supple. No tenderness.     Right lower leg: No edema.     Left lower leg: No edema.  Neurological:     Mental Status: She is alert.  Psychiatric:        Mood and Affect: Mood normal.        Behavior: Behavior normal.  Assessment & Plan:  Type 2 diabetes mellitus with diabetic neuropathy, unspecified whether long term insulin use (HCC) Assessment & Plan: Promptly request old records.  Assisted with multiple refills today.  Arrange for fairly close follow up as I get to know her better.  1800 ADA diet and regular low impact exercise advised.  Orders: -     Pregabalin ; Take 1 capsule (200 mg total) by mouth 2 (two) times daily.  Dispense: 180 capsule; Refill: 0 -     metFORMIN  HCl; Take 1 tablet (1,000 mg total) by mouth 2 (two) times daily with a meal.  Dispense: 180 tablet; Refill: 0 -     Hemoglobin A1c -     Comprehensive metabolic panel with GFR -     CBC with Differential/Platelet  Dyslipidemia Assessment & Plan: Low fat diet.  Rosuvastatin  refilled.  Orders: -     Rosuvastatin  Calcium ; Take 1 tablet (20 mg total) by mouth daily.  Dispense: 90 tablet; Refill: 0  Primary hypertension Assessment & Plan: Satisfactory control.  Losartan  refilled.  Orders: -     Losartan  Potassium; Take 1 tablet (25 mg total) by mouth daily.  Dispense: 90 tablet; Refill: 0  Anxiety Assessment & Plan: Controlled.  Obviously will need to get to know her better before agreeing to any long term Xanax  refills, but I'll help her for now.  Orders: -     DULoxetine  HCl; Take 1 capsule (60 mg total) by mouth daily.  Dispense: 90 capsule; Refill: 0 -     ALPRAZolam ; Take 1 tablet (1 mg total) by  mouth 2 (two) times daily as needed for anxiety.  Dispense: 60 tablet; Refill: 0  Primary osteoarthritis, unspecified site -     Diclofenac  Sodium; Take 1 tablet (75 mg total) by mouth 2 (two) times daily.  Dispense: 180 tablet; Refill: 0    Return in about 4 weeks (around 03/15/2024) for chronic follow-up.   REDDING PONCE NORLEEN FALCON., MD     [1]  Social History Tobacco Use   Smoking status: Every Day    Current packs/day: 0.50    Average packs/day: 0.5 packs/day for 40.0 years (20.0 ttl pk-yrs)    Types: Cigarettes   Smokeless tobacco: Never  Substance Use Topics   Alcohol use: No    Alcohol/week: 0.0 standard drinks of alcohol   Drug use: Never

## 2024-02-17 DIAGNOSIS — F419 Anxiety disorder, unspecified: Secondary | ICD-10-CM | POA: Insufficient documentation

## 2024-02-17 DIAGNOSIS — I10 Essential (primary) hypertension: Secondary | ICD-10-CM | POA: Insufficient documentation

## 2024-02-17 DIAGNOSIS — M1991 Primary osteoarthritis, unspecified site: Secondary | ICD-10-CM | POA: Insufficient documentation

## 2024-02-17 DIAGNOSIS — E785 Hyperlipidemia, unspecified: Secondary | ICD-10-CM | POA: Insufficient documentation

## 2024-02-17 LAB — COMPREHENSIVE METABOLIC PANEL WITH GFR
ALT: 17 IU/L (ref 0–32)
AST: 15 IU/L (ref 0–40)
Albumin: 4.7 g/dL (ref 3.8–4.9)
Alkaline Phosphatase: 58 IU/L (ref 49–135)
BUN/Creatinine Ratio: 21 (ref 9–23)
BUN: 16 mg/dL (ref 6–24)
Bilirubin Total: 0.4 mg/dL (ref 0.0–1.2)
CO2: 22 mmol/L (ref 20–29)
Calcium: 9.6 mg/dL (ref 8.7–10.2)
Chloride: 102 mmol/L (ref 96–106)
Creatinine, Ser: 0.78 mg/dL (ref 0.57–1.00)
Globulin, Total: 2.5 g/dL (ref 1.5–4.5)
Glucose: 117 mg/dL — ABNORMAL HIGH (ref 70–99)
Potassium: 4.8 mmol/L (ref 3.5–5.2)
Sodium: 141 mmol/L (ref 134–144)
Total Protein: 7.2 g/dL (ref 6.0–8.5)
eGFR: 87 mL/min/1.73 (ref >59)

## 2024-02-17 LAB — CBC WITH DIFFERENTIAL/PLATELET
Basophils Absolute: 0.1 x10E3/uL (ref 0.0–0.2)
Basos: 1 %
EOS (ABSOLUTE): 0.1 x10E3/uL (ref 0.0–0.4)
Eos: 2 %
Hematocrit: 45.6 % (ref 34.0–46.6)
Hemoglobin: 15 g/dL (ref 11.1–15.9)
Immature Grans (Abs): 0 x10E3/uL (ref 0.0–0.1)
Immature Granulocytes: 0 %
Lymphocytes Absolute: 3 x10E3/uL (ref 0.7–3.1)
Lymphs: 36 %
MCH: 31.1 pg (ref 26.6–33.0)
MCHC: 32.9 g/dL (ref 31.5–35.7)
MCV: 94 fL (ref 79–97)
Monocytes Absolute: 0.6 x10E3/uL (ref 0.1–0.9)
Monocytes: 7 %
Neutrophils Absolute: 4.4 x10E3/uL (ref 1.4–7.0)
Neutrophils: 53 %
Platelets: 371 x10E3/uL (ref 150–450)
RBC: 4.83 x10E6/uL (ref 3.77–5.28)
RDW: 13.3 % (ref 11.7–15.4)
WBC: 8.2 x10E3/uL (ref 3.4–10.8)

## 2024-02-17 LAB — HEMOGLOBIN A1C
Est. average glucose Bld gHb Est-mCnc: 163 mg/dL
Hgb A1c MFr Bld: 7.3 % — ABNORMAL HIGH (ref 4.8–5.6)

## 2024-02-17 NOTE — Assessment & Plan Note (Signed)
 Low fat diet.  Rosuvastatin  refilled.

## 2024-02-17 NOTE — Assessment & Plan Note (Signed)
 Promptly request old records.  Assisted with multiple refills today.  Arrange for fairly close follow up as I get to know her better.  1800 ADA diet and regular low impact exercise advised.

## 2024-02-17 NOTE — Assessment & Plan Note (Signed)
 Controlled.  Obviously will need to get to know her better before agreeing to any long term Xanax  refills, but I'll help her for now.

## 2024-02-17 NOTE — Assessment & Plan Note (Signed)
 Satisfactory control.  Losartan  refilled.

## 2024-02-18 ENCOUNTER — Ambulatory Visit (HOSPITAL_BASED_OUTPATIENT_CLINIC_OR_DEPARTMENT_OTHER): Payer: Self-pay | Admitting: Family Medicine

## 2024-03-12 ENCOUNTER — Other Ambulatory Visit (HOSPITAL_BASED_OUTPATIENT_CLINIC_OR_DEPARTMENT_OTHER): Payer: Self-pay | Admitting: Family Medicine

## 2024-03-21 ENCOUNTER — Encounter (HOSPITAL_BASED_OUTPATIENT_CLINIC_OR_DEPARTMENT_OTHER): Payer: Self-pay | Admitting: Family Medicine

## 2024-03-21 ENCOUNTER — Ambulatory Visit (INDEPENDENT_AMBULATORY_CARE_PROVIDER_SITE_OTHER): Admitting: Family Medicine

## 2024-03-21 VITALS — BP 137/81 | HR 84 | Temp 98.1°F | Resp 16 | Wt 167.6 lb

## 2024-03-21 DIAGNOSIS — E785 Hyperlipidemia, unspecified: Secondary | ICD-10-CM | POA: Diagnosis not present

## 2024-03-21 DIAGNOSIS — M1991 Primary osteoarthritis, unspecified site: Secondary | ICD-10-CM | POA: Diagnosis not present

## 2024-03-21 DIAGNOSIS — F419 Anxiety disorder, unspecified: Secondary | ICD-10-CM

## 2024-03-21 DIAGNOSIS — I10 Essential (primary) hypertension: Secondary | ICD-10-CM

## 2024-03-21 DIAGNOSIS — E1165 Type 2 diabetes mellitus with hyperglycemia: Secondary | ICD-10-CM | POA: Insufficient documentation

## 2024-03-21 NOTE — Assessment & Plan Note (Signed)
 1800 ADA diet and regular low impact exercise.  Recent A1c was near goal and I won't change any of her meds yet.  I advised her to see an eye doctor annually.

## 2024-03-21 NOTE — Assessment & Plan Note (Signed)
 Continue Rosuvastatin

## 2024-03-21 NOTE — Progress Notes (Signed)
 "  Established Patient Office Visit  Subjective   Patient ID: Karina Rice, female    DOB: [birth year]  Age: 59 y.o. MRN: [id number]  Chief Complaint  Patient presents with   Follow-up    Follow-up    Discussed the use of AI scribe software for clinical note transcription with the patient, who gave verbal consent to proceed.  History of Present Illness Karina Rice is a 59 year old female with prediabetes and fibromyalgia who presents for medication management and follow-up.  She has been managing prediabetes and has successfully reduced her weight from approximately 240-250 pounds to 167 pounds. She reports a preference for carbohydrates, which she feels makes it difficult to manage her blood sugar.  She is currently taking Xanax  daily to manage stress related to her caregiving responsibilities. Her brother-in-law left his 72 year old autistic daughter, who had a mental breakdown and was pregnant, in her care. She now also cares for a two-year-old, whose developmental status is uncertain. She finds this situation overwhelming, especially as she has never had children before.  She is on duloxetine  for depression and fibromyalgia, and Wellbutrin, which was initially prescribed to help her quit smoking. She reports that duloxetine  helps her symptoms, and she takes Wellbutrin at night because it helps her sleep. She recently resumed Wellbutrin after a three-week lapse and is adjusting to its effects again. She quit smoking a couple of years ago but continues Wellbutrin for its sleep benefits.  She has a history of fibromyalgia and arthritis, with pain primarily in her hips, back, and neck. She previously received cortisone shots and other treatments for her arthritis, which were not effective. She was on Stelara for her condition, which provided relief for a year to a year and a half per shot, but insurance issues have prevented her from continuing this treatment.  She is also seeing a  dermatologist for skin issues and has been on Stelara shots, which she finds effective but is currently unable to receive due to insurance coverage issues. She is part of a program that offers the shot at a reduced cost, but insurance approval is pending.  She has not had a colonoscopy and expresses reluctance towards it, citing no family history of colon cancer. Her last mammogram was three years ago, and she sees a gynecologist for her women's health needs.    Past Medical History:  Diagnosis Date   Anxiety    Colonoscopy refused    Diabetes mellitus without complication (HCC)    Fibroids    Fibromyalgia 2004   Hyperlipidemia    Hypertension    Ovarian failure    f/by GYN.  Mammogram urged annually.   Psoriasis    f/by [facility] dermatology    Outpatient Encounter Medications as of 03/21/2024  Medication Sig   albuterol  (PROVENTIL  HFA;VENTOLIN  HFA) 108 (90 Base) MCG/ACT inhaler Inhale 2 puffs into the lungs every 6 (six) hours as needed for wheezing or shortness of breath.   ALPRAZolam  (XANAX ) 1 MG tablet Take 1 tablet (1 mg total) by mouth 2 (two) times daily as needed for anxiety.   buPROPion (WELLBUTRIN XL) 150 MG 24 hr tablet TAKE 1 TABLET BY MOUTH DAILY FOR DEPRESSION   diclofenac  (VOLTAREN ) 75 MG EC tablet Take 1 tablet (75 mg total) by mouth 2 (two) times daily.   DULoxetine  (CYMBALTA ) 60 MG capsule Take 1 capsule (60 mg total) by mouth daily.   folic acid (FOLVITE) 1 MG tablet Take by mouth.   losartan  (COZAAR ) 25  MG tablet Take 1 tablet (25 mg total) by mouth daily.   metFORMIN  (GLUCOPHAGE ) 1000 MG tablet Take 1 tablet (1,000 mg total) by mouth 2 (two) times daily with a meal.   pregabalin  (LYRICA ) 200 MG capsule Take 1 capsule (200 mg total) by mouth 2 (two) times daily.   rosuvastatin  (CRESTOR ) 20 MG tablet Take 1 tablet (20 mg total) by mouth daily.   valACYclovir (VALTREX) 1000 MG tablet Take 1,000 mg by mouth 3 (three) times daily as needed.   estradiol (ESTRACE) 0.1  MG/GM vaginal cream Place 1 Applicatorful vaginally at bedtime. (Patient not taking: Reported on 02/16/2024)   montelukast (SINGULAIR) 10 MG tablet Take 10 mg by mouth at bedtime. (Patient not taking: Reported on 02/16/2024)   oxyCODONE -acetaminophen  (PERCOCET/ROXICET) 5-325 MG tablet Take 1 tablet by mouth every 6 (six) hours as needed for severe pain. (Patient not taking: Reported on 02/16/2024)   oxyCODONE -acetaminophen  (PERCOCET/ROXICET) 5-325 MG tablet Take 1 tablet by mouth every 4 (four) hours as needed for up to 3 doses. (Patient not taking: Reported on 02/16/2024)   Ustekinumab (STELARA) 45 MG/0.5ML SOLN Inject into the skin. (Patient not taking: Reported on 02/16/2024)   [DISCONTINUED] buPROPion (WELLBUTRIN SR) 150 MG 12 hr tablet Take 150 mg by mouth 2 (two) times daily.   No facility-administered encounter medications on file as of 03/21/2024.    Social History[1]    Review of Systems  Constitutional:  Negative for malaise/fatigue and weight loss.  Cardiovascular:  Negative for chest pain and palpitations.  Neurological:  Negative for dizziness.  Psychiatric/Behavioral:  Positive for depression. Negative for hallucinations, memory loss, substance abuse and suicidal ideas. The patient is nervous/anxious. The patient does not have insomnia.       Objective:     BP 137/81 (Cuff Size: Normal)   Pulse 84   Temp 98.1 F (36.7 C) (Oral)   Resp 16   Wt 167 lb 9.6 oz (76 kg)   SpO2 93%   BMI 29.33 kg/m    Physical Exam Constitutional:      General: She is not in acute distress.    Appearance: Normal appearance.  HENT:     Head: Normocephalic.  Neck:     Vascular: No carotid bruit.  Cardiovascular:     Rate and Rhythm: Normal rate and regular rhythm.     Pulses: Normal pulses.     Heart sounds: Normal heart sounds.  Pulmonary:     Effort: Pulmonary effort is normal.     Breath sounds: Normal breath sounds.  Abdominal:     General: Bowel sounds are normal.      Palpations: Abdomen is soft.  Musculoskeletal:     Cervical back: Neck supple. No tenderness.     Right lower leg: No edema.     Left lower leg: No edema.     Comments: Feet look good today.  Neurological:     Mental Status: She is alert.      No results found for any visits on 03/21/24.    The ASCVD Risk score (Arnett DK, et al., 2019) failed to calculate for the following reasons:   Cannot find a previous HDL lab   Cannot find a previous total cholesterol lab   * - Cholesterol units were assumed    Assessment & Plan:   Assessment & Plan Uncontrolled type 2 diabetes mellitus with hyperglycemia (HCC) 1800 ADA diet and regular low impact exercise.  Recent A1c was near goal and I won't change any of  her meds yet.  I advised her to see an eye doctor annually.    Primary hypertension Controlled.  Continue Cozaar .    Dyslipidemia Continue Rosuvastatin .    Anxiety Chronic anxiety managed with Xanax , duloxetine , and Wellbutrin. Xanax  is used daily, raising concerns about long-term use. Duloxetine  and Wellbutrin are only somewhat effective. - Referred to psychiatric nurse practitioner for evaluation and potential medication adjustment. - Continue current medications until psychiatric evaluation. Orders:   Ambulatory referral to Psychiatry  Primary osteoarthritis, unspecified site Continue weight loss.  F/u with Rheumatology as directed.       No follow-ups on file.    REDDING PONCE NORLEEN FALCON., MD    [1]  Social History Tobacco Use   Smoking status: Every Day    Current packs/day: 0.50    Average packs/day: 0.5 packs/day for 40.0 years (20.0 ttl pk-yrs)    Types: Cigarettes   Smokeless tobacco: Never  Substance Use Topics   Alcohol use: No    Alcohol/week: 0.0 standard drinks of alcohol   Drug use: Never   "

## 2024-03-21 NOTE — Assessment & Plan Note (Signed)
 Chronic anxiety managed with Xanax , duloxetine , and Wellbutrin. Xanax  is used daily, raising concerns about long-term use. Duloxetine  and Wellbutrin are only somewhat effective. - Referred to psychiatric nurse practitioner for evaluation and potential medication adjustment. - Continue current medications until psychiatric evaluation. Orders:   Ambulatory referral to Psychiatry

## 2024-03-21 NOTE — Assessment & Plan Note (Signed)
 Controlled.  Continue Cozaar .

## 2024-03-21 NOTE — Assessment & Plan Note (Signed)
 Continue weight loss.  F/u with Rheumatology as directed.

## 2024-06-20 ENCOUNTER — Ambulatory Visit (HOSPITAL_BASED_OUTPATIENT_CLINIC_OR_DEPARTMENT_OTHER): Admitting: Family Medicine
